# Patient Record
Sex: Male | Born: 1955 | Race: Black or African American | Hispanic: No | Marital: Married | Smoking: Never smoker
Health system: Southern US, Community
[De-identification: ages and names within clinical notes are randomized; demographics above are authoritative.]

## PROBLEM LIST (undated history)

## (undated) DIAGNOSIS — N200 Calculus of kidney: Secondary | ICD-10-CM

## (undated) DIAGNOSIS — B029 Zoster without complications: Secondary | ICD-10-CM

## (undated) DIAGNOSIS — S92001A Unspecified fracture of right calcaneus, initial encounter for closed fracture: Secondary | ICD-10-CM

## (undated) DIAGNOSIS — Z87442 Personal history of urinary calculi: Secondary | ICD-10-CM

## (undated) DIAGNOSIS — E119 Type 2 diabetes mellitus without complications: Secondary | ICD-10-CM

## (undated) HISTORY — PX: URETEROSCOPY: SHX842

## (undated) HISTORY — DX: Zoster without complications: B02.9

## (undated) HISTORY — PX: LITHOTRIPSY: SUR834

---

## 2008-04-24 ENCOUNTER — Encounter: Admission: RE | Admit: 2008-04-24 | Discharge: 2008-04-24 | Payer: Self-pay | Admitting: Orthopedic Surgery

## 2015-04-21 ENCOUNTER — Emergency Department (HOSPITAL_BASED_OUTPATIENT_CLINIC_OR_DEPARTMENT_OTHER)
Admission: EM | Admit: 2015-04-21 | Discharge: 2015-04-21 | Disposition: A | Discharge status: Home / Self Care | Payer: BLUE CROSS/BLUE SHIELD | Attending: Emergency Medicine | Admitting: Emergency Medicine

## 2015-04-21 ENCOUNTER — Emergency Department (HOSPITAL_BASED_OUTPATIENT_CLINIC_OR_DEPARTMENT_OTHER): Payer: BLUE CROSS/BLUE SHIELD

## 2015-04-21 ENCOUNTER — Encounter (HOSPITAL_BASED_OUTPATIENT_CLINIC_OR_DEPARTMENT_OTHER): Payer: Self-pay

## 2015-04-21 DIAGNOSIS — S92001A Unspecified fracture of right calcaneus, initial encounter for closed fracture: Secondary | ICD-10-CM | POA: Diagnosis not present

## 2015-04-21 DIAGNOSIS — Y929 Unspecified place or not applicable: Secondary | ICD-10-CM | POA: Insufficient documentation

## 2015-04-21 DIAGNOSIS — Y9389 Activity, other specified: Secondary | ICD-10-CM | POA: Diagnosis not present

## 2015-04-21 DIAGNOSIS — Y998 Other external cause status: Secondary | ICD-10-CM | POA: Diagnosis not present

## 2015-04-21 DIAGNOSIS — S99911A Unspecified injury of right ankle, initial encounter: Secondary | ICD-10-CM | POA: Diagnosis present

## 2015-04-21 DIAGNOSIS — W11XXXA Fall on and from ladder, initial encounter: Secondary | ICD-10-CM | POA: Insufficient documentation

## 2015-04-21 DIAGNOSIS — Z87442 Personal history of urinary calculi: Secondary | ICD-10-CM | POA: Insufficient documentation

## 2015-04-21 DIAGNOSIS — S92009A Unspecified fracture of unspecified calcaneus, initial encounter for closed fracture: Secondary | ICD-10-CM

## 2015-04-21 HISTORY — DX: Calculus of kidney: N20.0

## 2015-04-21 MED ORDER — OXYCODONE-ACETAMINOPHEN 5-325 MG PO TABS: 1.0000 | ORAL_TABLET | Freq: Four times a day (QID) | ORAL | Status: DC | PRN

## 2015-04-21 MED ORDER — OXYCODONE-ACETAMINOPHEN 5-325 MG PO TABS
1.0000 | ORAL_TABLET | Freq: Once | ORAL | Status: AC
  Administered 2015-04-21: 1 via ORAL
  Filled 2015-04-21: qty 1

## 2015-04-21 NOTE — Discharge Instructions (Signed)
Cast or Splint Care °Casts and splints support injured limbs and keep bones from moving while they heal. It is important to care for your cast or splint at home.   °HOME CARE INSTRUCTIONS °· Keep the cast or splint uncovered during the drying period. It can take 24 to 48 hours to dry if it is made of plaster. A fiberglass cast will dry in less than 1 hour. °· Do not rest the cast on anything harder than a pillow for the first 24 hours. °· Do not put weight on your injured limb or apply pressure to the cast until your health care provider gives you permission. °· Keep the cast or splint dry. Wet casts or splints can lose their shape and may not support the limb as well. A wet cast that has lost its shape can also create harmful pressure on your skin when it dries. Also, wet skin can become infected. °· Cover the cast or splint with a plastic bag when bathing or when out in the rain or snow. If the cast is on the trunk of the body, take sponge baths until the cast is removed. °· If your cast does become wet, dry it with a towel or a blow dryer on the cool setting only. °· Keep your cast or splint clean. Soiled casts may be wiped with a moistened cloth. °· Do not place any hard or soft foreign objects under your cast or splint, such as cotton, toilet paper, lotion, or powder. °· Do not try to scratch the skin under the cast with any object. The object could get stuck inside the cast. Also, scratching could lead to an infection. If itching is a problem, use a blow dryer on a cool setting to relieve discomfort. °· Do not trim or cut your cast or remove padding from inside of it. °· Exercise all joints next to the injury that are not immobilized by the cast or splint. For example, if you have a long leg cast, exercise the hip joint and toes. If you have an arm cast or splint, exercise the shoulder, elbow, thumb, and fingers. °· Elevate your injured arm or leg on 1 or 2 pillows for the first 1 to 3 days to decrease  swelling and pain. It is best if you can comfortably elevate your cast so it is higher than your heart. °SEEK MEDICAL CARE IF:  °· Your cast or splint cracks. °· Your cast or splint is too tight or too loose. °· You have unbearable itching inside the cast. °· Your cast becomes wet or develops a soft spot or area. °· You have a bad smell coming from inside your cast. °· You get an object stuck under your cast. °· Your skin around the cast becomes red or raw. °· You have new pain or worsening pain after the cast has been applied. °SEEK IMMEDIATE MEDICAL CARE IF:  °· You have fluid leaking through the cast. °· You are unable to move your fingers or toes. °· You have discolored (blue or white), cool, painful, or very swollen fingers or toes beyond the cast. °· You have tingling or numbness around the injured area. °· You have severe pain or pressure under the cast. °· You have any difficulty with your breathing or have shortness of breath. °· You have chest pain. °Document Released: 09/08/2000 Document Revised: 07/02/2013 Document Reviewed: 03/20/2013 °[employer]® Patient Information ©2015 [employer]. This information is not intended to replace advice given to you by your health care   provider. Make sure you discuss any questions you have with your health care provider.  Calcaneal Fracture Repair There are many different ways of treating fractures of the large irregular bone in the foot that makes up the heel of the foot (calcaneus). Calcaneal fractures can be treated with:   Immobilization--The fracture is casted as it is without changing the positions of the fracture involved.   Closed reduction--The bones are manipulated back into position without opening the site of the fracture using surgery.   Open reduction and internal fixation--The fracture site is opened and the bone pieces are fixed into place with some type of hardware (such as a screw).   Primary arthrodesis--The joint has enough damage that a  procedure is done as the first treatment which will leave the joint permanently stiff. This will decrease function, however usually will leave the joint pain free. LET [facility] CARE PROVIDER KNOW ABOUT:  Any allergies you have.   All medicines you are taking, including vitamins, herbs, eye drops, creams, and over-the-counter medicines.   Previous problems you or members of your family have had with the use of anesthetics.  Any blood disorders you have.   Previous surgeries you have had.   Medical conditions you have.  RISKS AND COMPLICATIONS Generally, calcaneal fracture repair is a safe procedure. However, as with any procedure, complications can occur. Possible complications include:   Swelling of the foot and ankle.  Infection of the wound or bone.  Arthritis.  Chronic pain of the foot.  Nerve injury.  Blood clot in the legs or lungs. BEFORE THE PROCEDURE  Ask your health care provider about changing or stopping your regular medicines. You may need to stop taking certain medicines, such as aspirin or blood thinners, at least 1 week before the surgery.  X-rays and any imaging studies are reviewed with your healthcare provider. The surgeon will advise you on the best surgical approach to repair your fracture.  Do not eat or drink anything for at least 8 hours before the surgery or as directed by your health care provider.   If you smoke, do not smoke for at least 2 weeks before the surgery.   Make plans to have someone drive you home after the procedure. Also arrange for someone to help you with activities during recovery.  PROCEDURE   You will be given medicine to help you relax (sedative). You will then be given medicine to make you sleep through the procedure (general anesthetic). These medicines will be given through an IV access tube that is put into one of your veins.   A nerve block or numbing medicine (local anesthetic) may also be used to keep you  comfortable.  Once you are asleep, the foot will be cleaned and shaved if needed.  The surgeon may use a percutaneous or open technique for this surgery:  In the percutaneous approach, small cuts and pins are used to repair the fracture.  In the open technique, a cut is made along the outside of the foot and the bone pieces are placed back together with hardware. A drain may be left to collect fluid. It is removed 3-4 days after the procedure.  The surgeon then uses staples or stitches to close the incision or cuts. AFTER THE PROCEDURE  After surgery you will be taken to the recovery area where a nurse will watch and check your progress for 1-3 hours. Once you are awake, stable, and taking fluids well, and if you do not  have any other problems, you will be allowed to go home.  You will be given pain medicine if needed.  The IV access tube will be removed before you are discharged. Document Released: 06/21/2005 Document Revised: 07/02/2013 Document Reviewed: 04/15/2013 [facility] Patient Information 2015 [place], [place]. This information is not intended to replace advice given to you by your health care provider. Make sure you discuss any questions you have with your health care provider.

## 2015-04-21 NOTE — ED Notes (Signed)
Patient transported to CT 

## 2015-04-21 NOTE — ED Provider Notes (Signed)
CSN: [id number]     Arrival date & time 04/21/15  1442 History   First MD Initiated Contact with Patient 04/21/15 1447     Chief Complaint  Patient presents with  . Ankle Injury     (Consider location/radiation/quality/duration/timing/severity/associated sxs/prior Treatment) HPI Comments: Pt. Is a 59 y/o M who presents today after falling from 2-3 ft. Ladder while trying to trim his bushes. He says that he lost his balance, and fell but his right foot remained stuck in the ladder rung. He says that his right foot twisted laterally, and he immediately experienced pain. He there was no abnormal angulation, but he was unable to bear weight. He also had immediate swelling. He did not have any loss of sensation, numbness, tingling, or change in color. He did not have any pain in his knee, or hip. He says he did not hit his head. No LOC. He fell on his right side, but he says that he has no pain any where else.   The history is provided by the patient.    Past Medical History  Diagnosis Date  . Kidney stone    History reviewed. No pertinent past surgical history. No family history on file. History  Substance Use Topics  . Smoking status: Never Smoker   . Smokeless tobacco: Not on file  . Alcohol Use: No    Review of Systems  Constitutional: Positive for activity change. Negative for fever.  HENT: Negative for congestion, facial swelling, postnasal drip, sinus pressure and sore throat.   Eyes: Negative.  Negative for pain and redness.  Respiratory: Negative.  Negative for cough, choking, chest tightness and shortness of breath.   Cardiovascular: Negative.  Negative for chest pain and leg swelling.  Gastrointestinal: Negative.  Negative for nausea and vomiting.  Endocrine: Negative.  Negative for cold intolerance and heat intolerance.  Genitourinary: Negative.  Negative for urgency and frequency.  Musculoskeletal: Positive for joint swelling and gait problem. Negative for myalgias, back  pain, arthralgias, neck pain and neck stiffness.  Skin: Negative.  Negative for pallor, rash and wound.  Allergic/Immunologic: Negative.  Negative for environmental allergies.  Neurological: Negative.  Negative for dizziness, weakness, light-headedness, numbness and headaches.  Hematological: Negative.  Negative for adenopathy. Does not bruise/bleed easily.  Psychiatric/Behavioral: Negative.  Negative for agitation.      Allergies  Review of patient's allergies indicates no known allergies.  Home Medications   Prior to Admission medications   Not on File   BP 156/90 mmHg  Pulse 95  Temp(Src) 98.4 F (36.9 C) (Oral)  Resp 18  Ht 6\' 4"  (1.93 m)  Wt 225 lb (102.059 kg)  BMI 27.40 kg/m2  SpO2 98% Physical Exam  Constitutional: He is oriented to person, place, and time. He appears well-developed and well-nourished. No distress.  HENT:  Head: Normocephalic and atraumatic.  Mouth/Throat: Oropharynx is clear and moist. No oropharyngeal exudate.  Eyes: Conjunctivae and EOM are normal. Pupils are equal, round, and reactive to light. Right eye exhibits no discharge. Left eye exhibits no discharge.  Neck: Normal range of motion. Neck supple.  Cardiovascular: Normal rate, regular rhythm, normal heart sounds and intact distal pulses.  Exam reveals no gallop and no friction rub.   No murmur heard. Pulmonary/Chest: Effort normal and breath sounds normal. No respiratory distress. He has no wheezes. He has no rales.  Abdominal: Soft. Bowel sounds are normal. He exhibits no distension and no mass. There is no tenderness. There is no rebound and no guarding.  Musculoskeletal: He exhibits no edema.       Right ankle: He exhibits decreased range of motion and swelling. He exhibits no ecchymosis, no deformity, no laceration and normal pulse. Tenderness. Lateral malleolus and medial malleolus tenderness found. Achilles tendon normal.       Feet:  Lymphadenopathy:    He has no cervical adenopathy.    Neurological: He is alert and oriented to person, place, and time. He displays normal reflexes. No cranial nerve deficit. He exhibits normal muscle tone. Coordination normal.  Skin: Skin is warm and dry. No rash noted. He is not diaphoretic. No erythema. No pallor.  Psychiatric: He has a normal mood and affect. His behavior is normal.    ED Course  Procedures (including critical care time) Labs Review Labs Reviewed - No data to display  Imaging Review Dg Ankle Complete Right  04/21/2015   CLINICAL DATA:  Pain following fall from ladder  EXAM: RIGHT ANKLE - COMPLETE 3+ VIEW  COMPARISON:  None.  FINDINGS: Frontal, oblique, and lateral views obtained. There is generalized soft tissue swelling. There is a comminuted calcaneal fracture with multiple impacted fracture fragments throughout the calcaneus. Displaced fragments are noted primarily posteriorly, although areas of fracture are seen through the anterior to mid aspects of the bone as well. There is no gross dislocation. No fractures outside of the calcaneus are identified. The ankle mortise appears intact.  IMPRESSION: Comminuted fracture of the calcaneus with multiple impacted fracture fragments. Given the degree of fracture involvement with the calcaneus, correlation with noncontrast enhanced calcaneal CT would be advised. No fracture outside of the calcaneus seen. Ankle mortise appears intact.   Electronically Signed   By: Lowella Grip III M.D.   On: 04/21/2015 15:12     EKG Interpretation None      MDM   Final diagnoses:  Calcaneus fracture   Pt. Is a 59 y/o M here with comminuted calcaneal fracture. Neurovascularly intact. Will get CT calcaneal series. Jones dressing and Ice / Crutches. Pain control with percocet going home. Discussed with orthopedics Dr. Lorre Nick. He recommended the above, and will find out where he should follow up for further intervention. Signed out to Dr. Ralene Bathe - Attending at 4:00 pm.      Aquilla Hacker,  MD 04/21/15 [place], MD 04/21/15 [phone]

## 2015-04-21 NOTE — ED Notes (Signed)
Injured right ankle falling off ladder approx 230pm

## 2015-04-21 NOTE — ED Notes (Signed)
Family at bedside. 

## 2015-04-22 ENCOUNTER — Other Ambulatory Visit (HOSPITAL_COMMUNITY): Payer: Self-pay | Admitting: Orthopedic Surgery

## 2015-04-23 ENCOUNTER — Encounter (HOSPITAL_COMMUNITY): Payer: Self-pay | Admitting: *Deleted

## 2015-04-23 NOTE — Progress Notes (Signed)
Pt denies SOB, chest pain, and being under the care of a cardiologist. Pt denies having a stress test, echo, and cardiac cath. Pt denies having a chest x ray and EKG within the last year. Pt made aware to stop  taking Aspirin, otc vitamins and herbal medications. Do not take any NSAIDs ie: Ibuprofen, Advil, Naproxen or any medication containing Aspirin. Pt verbalized understanding of all pre-op instructions.

## 2015-04-25 ENCOUNTER — Encounter (HOSPITAL_COMMUNITY): Payer: Self-pay | Admitting: Anesthesiology

## 2015-04-25 ENCOUNTER — Ambulatory Visit (HOSPITAL_COMMUNITY): Payer: BLUE CROSS/BLUE SHIELD | Admitting: Anesthesiology

## 2015-04-25 ENCOUNTER — Encounter (HOSPITAL_COMMUNITY)
Admission: RE | Disposition: A | Discharge status: Home / Self Care | Payer: Self-pay | Source: Ambulatory Visit | Attending: Orthopedic Surgery

## 2015-04-25 ENCOUNTER — Ambulatory Visit (HOSPITAL_COMMUNITY)
Admission: RE | Admit: 2015-04-25 | Discharge: 2015-04-25 | Disposition: A | Discharge status: Home / Self Care | Payer: BLUE CROSS/BLUE SHIELD | Source: Ambulatory Visit | Attending: Orthopedic Surgery | Admitting: Orthopedic Surgery

## 2015-04-25 DIAGNOSIS — W11XXXA Fall on and from ladder, initial encounter: Secondary | ICD-10-CM | POA: Insufficient documentation

## 2015-04-25 DIAGNOSIS — S92001A Unspecified fracture of right calcaneus, initial encounter for closed fracture: Secondary | ICD-10-CM

## 2015-04-25 HISTORY — DX: Unspecified fracture of right calcaneus, initial encounter for closed fracture: S92.001A

## 2015-04-25 HISTORY — PX: ORIF CALCANEOUS FRACTURE: SHX5030

## 2015-04-25 SURGERY — OPEN REDUCTION INTERNAL FIXATION (ORIF) CALCANEOUS FRACTURE
Anesthesia: Regional | Site: Ankle | Laterality: Right

## 2015-04-25 MED ORDER — OXYCODONE HCL 5 MG/5ML PO SOLN: 5.0000 mg | Freq: Once | ORAL | Status: DC | PRN

## 2015-04-25 MED ORDER — MIDAZOLAM HCL 5 MG/5ML IJ SOLN
INTRAMUSCULAR | Status: DC | PRN
  Administered 2015-04-25: 2 mg via INTRAVENOUS

## 2015-04-25 MED ORDER — 0.9 % SODIUM CHLORIDE (POUR BTL) OPTIME
TOPICAL | Status: DC | PRN
  Administered 2015-04-25: 1000 mL

## 2015-04-25 MED ORDER — GLYCOPYRROLATE 0.2 MG/ML IJ SOLN
INTRAMUSCULAR | Status: AC
  Filled 2015-04-25: qty 1

## 2015-04-25 MED ORDER — MEPERIDINE HCL 25 MG/ML IJ SOLN: 6.2500 mg | INTRAMUSCULAR | Status: DC | PRN

## 2015-04-25 MED ORDER — ASPIRIN EC 325 MG PO TBEC: 325.0000 mg | DELAYED_RELEASE_TABLET | Freq: Every day | ORAL | Status: DC

## 2015-04-25 MED ORDER — FENTANYL CITRATE (PF) 100 MCG/2ML IJ SOLN
INTRAMUSCULAR | Status: DC | PRN
  Administered 2015-04-25 (×3): 50 ug via INTRAVENOUS

## 2015-04-25 MED ORDER — ONDANSETRON HCL 4 MG/2ML IJ SOLN
INTRAMUSCULAR | Status: DC | PRN
  Administered 2015-04-25: 4 mg via INTRAVENOUS

## 2015-04-25 MED ORDER — ROCURONIUM BROMIDE 50 MG/5ML IV SOLN
INTRAVENOUS | Status: AC
  Filled 2015-04-25: qty 1

## 2015-04-25 MED ORDER — PROPOFOL 10 MG/ML IV BOLUS
INTRAVENOUS | Status: AC
  Filled 2015-04-25: qty 20

## 2015-04-25 MED ORDER — HYDROMORPHONE HCL 1 MG/ML IJ SOLN: 0.2500 mg | INTRAMUSCULAR | Status: DC | PRN

## 2015-04-25 MED ORDER — FENTANYL CITRATE (PF) 250 MCG/5ML IJ SOLN
INTRAMUSCULAR | Status: AC
  Filled 2015-04-25: qty 5

## 2015-04-25 MED ORDER — EPHEDRINE SULFATE 50 MG/ML IJ SOLN
INTRAMUSCULAR | Status: DC | PRN
  Administered 2015-04-25 (×4): 10 mg via INTRAVENOUS

## 2015-04-25 MED ORDER — BUPIVACAINE-EPINEPHRINE (PF) 0.5% -1:200000 IJ SOLN
INTRAMUSCULAR | Status: DC | PRN
  Administered 2015-04-25: 30 mL via PERINEURAL

## 2015-04-25 MED ORDER — ONDANSETRON HCL 4 MG/2ML IJ SOLN
INTRAMUSCULAR | Status: AC
  Filled 2015-04-25: qty 2

## 2015-04-25 MED ORDER — LIDOCAINE HCL (CARDIAC) 20 MG/ML IV SOLN
INTRAVENOUS | Status: DC | PRN
  Administered 2015-04-25: 100 mg via INTRAVENOUS

## 2015-04-25 MED ORDER — CEFAZOLIN SODIUM-DEXTROSE 2-3 GM-% IV SOLR
INTRAVENOUS | Status: AC
  Filled 2015-04-25: qty 50

## 2015-04-25 MED ORDER — SODIUM CHLORIDE 0.9 % IJ SOLN
INTRAMUSCULAR | Status: AC
  Filled 2015-04-25: qty 10

## 2015-04-25 MED ORDER — PROPOFOL 10 MG/ML IV BOLUS
INTRAVENOUS | Status: DC | PRN
  Administered 2015-04-25: 300 mg via INTRAVENOUS

## 2015-04-25 MED ORDER — DEXAMETHASONE SODIUM PHOSPHATE 4 MG/ML IJ SOLN
INTRAMUSCULAR | Status: AC
Start: 2015-04-25 — End: 2015-04-25
  Filled 2015-04-25: qty 1

## 2015-04-25 MED ORDER — PHENYLEPHRINE 40 MCG/ML (10ML) SYRINGE FOR IV PUSH (FOR BLOOD PRESSURE SUPPORT)
PREFILLED_SYRINGE | INTRAVENOUS | Status: AC
  Filled 2015-04-25: qty 10

## 2015-04-25 MED ORDER — OXYCODONE HCL 5 MG PO TABS: 5.0000 mg | ORAL_TABLET | Freq: Once | ORAL | Status: DC | PRN

## 2015-04-25 MED ORDER — CEFAZOLIN SODIUM-DEXTROSE 2-3 GM-% IV SOLR
2.0000 g | Freq: Once | INTRAVENOUS | Status: AC
  Administered 2015-04-25: 2 g via INTRAVENOUS
  Filled 2015-04-25: qty 50

## 2015-04-25 MED ORDER — MIDAZOLAM HCL 2 MG/2ML IJ SOLN
INTRAMUSCULAR | Status: AC
  Filled 2015-04-25: qty 2

## 2015-04-25 MED ORDER — PHENYLEPHRINE HCL 10 MG/ML IJ SOLN
INTRAMUSCULAR | Status: DC | PRN
  Administered 2015-04-25 (×2): 80 ug via INTRAVENOUS
  Administered 2015-04-25 (×4): 40 ug via INTRAVENOUS

## 2015-04-25 MED ORDER — LIDOCAINE HCL (CARDIAC) 20 MG/ML IV SOLN
INTRAVENOUS | Status: AC
  Filled 2015-04-25: qty 5

## 2015-04-25 MED ORDER — DEXAMETHASONE SODIUM PHOSPHATE 4 MG/ML IJ SOLN
INTRAMUSCULAR | Status: DC | PRN
  Administered 2015-04-25: 4 mg via INTRAVENOUS

## 2015-04-25 MED ORDER — SUCCINYLCHOLINE CHLORIDE 20 MG/ML IJ SOLN
INTRAMUSCULAR | Status: AC
  Filled 2015-04-25: qty 1

## 2015-04-25 MED ORDER — LACTATED RINGERS IV SOLN
INTRAVENOUS | Status: DC | PRN
  Administered 2015-04-25 (×2): via INTRAVENOUS

## 2015-04-25 MED ORDER — OXYCODONE-ACETAMINOPHEN 5-325 MG PO TABS: 1.0000 | ORAL_TABLET | ORAL | Status: DC | PRN

## 2015-04-25 MED ORDER — EPHEDRINE SULFATE 50 MG/ML IJ SOLN
INTRAMUSCULAR | Status: AC
  Filled 2015-04-25: qty 1

## 2015-04-25 SURGICAL SUPPLY — 55 items
BANDAGE ELASTIC 4 VELCRO ST LF (GAUZE/BANDAGES/DRESSINGS) IMPLANT
BANDAGE ELASTIC 6 VELCRO ST LF (GAUZE/BANDAGES/DRESSINGS) IMPLANT
BANDAGE ESMARK 6X9 LF (GAUZE/BANDAGES/DRESSINGS) IMPLANT
BIT DRILL LCP QC 2X140 (BIT) ×3 IMPLANT
BNDG COHESIVE 6X5 TAN STRL LF (GAUZE/BANDAGES/DRESSINGS) ×3 IMPLANT
BNDG ESMARK 6X9 LF (GAUZE/BANDAGES/DRESSINGS)
BNDG GAUZE ELAST 4 BULKY (GAUZE/BANDAGES/DRESSINGS) ×3 IMPLANT
BNDG GAUZE STRTCH 6 (GAUZE/BANDAGES/DRESSINGS) ×9 IMPLANT
COTTON STERILE ROLL (GAUZE/BANDAGES/DRESSINGS) ×3 IMPLANT
COVER SURGICAL LIGHT HANDLE (MISCELLANEOUS) ×6 IMPLANT
CUFF TOURNIQUET SINGLE 34IN LL (TOURNIQUET CUFF) IMPLANT
CUFF TOURNIQUET SINGLE 44IN (TOURNIQUET CUFF) IMPLANT
DRAPE INCISE IOBAN 66X45 STRL (DRAPES) ×3 IMPLANT
DRAPE OEC MINIVIEW 54X84 (DRAPES) ×3 IMPLANT
DRAPE PROXIMA HALF (DRAPES) ×3 IMPLANT
DRAPE U-SHAPE 47X51 STRL (DRAPES) ×3 IMPLANT
DRSG ADAPTIC 3X8 NADH LF (GAUZE/BANDAGES/DRESSINGS) ×3 IMPLANT
DRSG PAD ABDOMINAL 8X10 ST (GAUZE/BANDAGES/DRESSINGS) ×3 IMPLANT
DURAPREP 26ML APPLICATOR (WOUND CARE) ×3 IMPLANT
ELECT REM PT RETURN 9FT ADLT (ELECTROSURGICAL) ×3
ELECTRODE REM PT RTRN 9FT ADLT (ELECTROSURGICAL) ×1 IMPLANT
GAUZE SPONGE 4X4 12PLY STRL (GAUZE/BANDAGES/DRESSINGS) ×3 IMPLANT
GLOVE BIOGEL PI IND STRL 9 (GLOVE) ×1 IMPLANT
GLOVE BIOGEL PI INDICATOR 9 (GLOVE) ×2
GLOVE SURG ORTHO 9.0 STRL STRW (GLOVE) ×3 IMPLANT
GOWN STRL REUS W/ TWL XL LVL3 (GOWN DISPOSABLE) ×3 IMPLANT
GOWN STRL REUS W/TWL XL LVL3 (GOWN DISPOSABLE) ×6
KIT BASIN OR (CUSTOM PROCEDURE TRAY) ×3 IMPLANT
KIT ROOM TURNOVER OR (KITS) ×3 IMPLANT
MANIFOLD NEPTUNE II (INSTRUMENTS) ×3 IMPLANT
NS IRRIG 1000ML POUR BTL (IV SOLUTION) ×3 IMPLANT
PACK ORTHO EXTREMITY (CUSTOM PROCEDURE TRAY) ×3 IMPLANT
PAD ARMBOARD 7.5X6 YLW CONV (MISCELLANEOUS) ×6 IMPLANT
PADDING CAST COTTON 6X4 STRL (CAST SUPPLIES) ×3 IMPLANT
PLATE CALCANEAL LOCK RT VA 2.7 (Plate) ×3 IMPLANT
SCREW 2.7X28MM (Screw) ×6 IMPLANT
SCREW 2.7X38MM (Screw) ×3 IMPLANT
SCREW LOCKING 2.7X44MM VA (Screw) ×3 IMPLANT
SCREW LOCKING VA 2.7X40MM (Screw) ×3 IMPLANT
SCREW LOCKING VA 2.7X50MM (Screw) ×3 IMPLANT
SCREW METAPHYSEAL 2.7X30 (Screw) ×3 IMPLANT
SCREW METAPHYSEAL 2.7X42MM (Screw) ×3 IMPLANT
SCREW SHANZ 5.0X125 (Screw) ×3 IMPLANT
SCREW VA LOCKING 2.7X36 (Screw) ×2 IMPLANT
SCREW VA LOCKING 2.7X36 VA (Screw) ×1 IMPLANT
SPONGE LAP 18X18 X RAY DECT (DISPOSABLE) ×3 IMPLANT
STAPLER VISISTAT 35W (STAPLE) IMPLANT
SUCTION FRAZIER TIP 10 FR DISP (SUCTIONS) ×3 IMPLANT
SUT ETHILON 2 0 PSLX (SUTURE) IMPLANT
SUT VIC AB 2-0 CTB1 (SUTURE) ×6 IMPLANT
TOWEL OR 17X24 6PK STRL BLUE (TOWEL DISPOSABLE) ×3 IMPLANT
TOWEL OR 17X26 10 PK STRL BLUE (TOWEL DISPOSABLE) ×3 IMPLANT
TUBE CONNECTING 12'X1/4 (SUCTIONS) ×1
TUBE CONNECTING 12X1/4 (SUCTIONS) ×2 IMPLANT
WATER STERILE IRR 1000ML POUR (IV SOLUTION) ×3 IMPLANT

## 2015-04-25 NOTE — Discharge Instructions (Signed)
Calcaneal Fracture Repair  There are many different ways of treating fractures of the large irregular bone in the foot that makes up the heel of the foot (calcaneus). Calcaneal fractures can be treated with:    Immobilization--The fracture is casted as it is without changing the positions of the fracture involved.    Closed reduction--The bones are manipulated back into position without opening the site of the fracture using surgery.    Open reduction and internal fixation--The fracture site is opened and the bone pieces are fixed into place with some type of hardware (such as a screw).    Primary arthrodesis--The joint has enough damage that a procedure is done as the first treatment which will leave the joint permanently stiff. This will decrease function, however usually will leave the joint pain free.  LET YOUR HEALTH CARE PROVIDER KNOW ABOUT:   Any allergies you have.    All medicines you are taking, including vitamins, herbs, eye drops, creams, and over-the-counter medicines.    Previous problems you or members of your family have had with the use of anesthetics.   Any blood disorders you have.    Previous surgeries you have had.    Medical conditions you have.   RISKS AND COMPLICATIONS  Generally, calcaneal fracture repair is a safe procedure. However, as with any procedure, complications can occur. Possible complications include:    Swelling of the foot and ankle.   Infection of the wound or bone.   Arthritis.   Chronic pain of the foot.   Nerve injury.   Blood clot in the legs or lungs.  BEFORE THE PROCEDURE   Ask your health care provider about changing or stopping your regular medicines. You may need to stop taking certain medicines, such as aspirin or blood thinners, at least 1 week before the surgery.   X-rays and any imaging studies are reviewed with your healthcare provider. The surgeon will advise you on the best surgical approach to repair your fracture.   Do not eat or  drink anything for at least 8 hours before the surgery or as directed by your health care provider.    If you smoke, do not smoke for at least 2 weeks before the surgery.    Make plans to have someone drive you home after the procedure. Also arrange for someone to help you with activities during recovery.   PROCEDURE    You will be given medicine to help you relax (sedative). You will then be given medicine to make you sleep through the procedure (general anesthetic). These medicines will be given through an IV access tube that is put into one of your veins.    A nerve block or numbing medicine (local anesthetic) may also be used to keep you comfortable.   Once you are asleep, the foot will be cleaned and shaved if needed.   The surgeon may use a percutaneous or open technique for this surgery:    In the percutaneous approach, small cuts and pins are used to repair the fracture.    In the open technique, a cut is made along the outside of the foot and the bone pieces are placed back together with hardware. A drain may be left to collect fluid. It is removed 3-4 days after the procedure.   The surgeon then uses staples or stitches to close the incision or cuts.  AFTER THE PROCEDURE   After surgery you will be taken to the recovery area where a nurse will   pain medicine if needed.  The IV access tube will be removed before you are discharged. Document Released: 06/21/2005 Document Revised: 07/02/2013 Document Reviewed: 04/15/2013 [facility] Patient Information 2015 [place], [place]. This information is not intended to replace advice given to you by your health care provider. Make sure you discuss any questions you have with your health care provider.

## 2015-04-25 NOTE — Addendum Note (Signed)
Addendum  created 04/25/15 1142 by Jenne Campus, CRNA   Modules edited: Clinical Notes   Clinical Notes:  File: [id number]

## 2015-04-25 NOTE — Anesthesia Postprocedure Evaluation (Signed)
  Anesthesia Post-op Note  Patient: Stephen Gill  Procedure(s) Performed: Procedure(s): OPEN REDUCTION INTERNAL FIXATION (ORIF) RIGHT CALCANEOUS FRACTURE (Right)  Patient Location: PACU  Anesthesia Type: General, Regional   Level of Consciousness: awake, alert  and oriented  Airway and Oxygen Therapy: Patient Spontanous Breathing  Post-op Pain: none  Post-op Assessment: Post-op Vital signs reviewed  Post-op Vital Signs: Reviewed  Last Vitals:  Filed Vitals:   04/25/15 0950  BP: 136/74  Pulse: 72  Temp: 36.4 C  Resp: 16    Complications: No apparent anesthesia complications

## 2015-04-25 NOTE — Op Note (Signed)
04/25/2015  9:38 AM  PATIENT:  Stephen Gill    PRE-OPERATIVE DIAGNOSIS:  Right Calcaneus Fracture  POST-OPERATIVE DIAGNOSIS:  Same  PROCEDURE:  OPEN REDUCTION INTERNAL FIXATION (ORIF) RIGHT CALCANEOUS FRACTURE  SURGEON:  Newt Minion, MD  PHYSICIAN ASSISTANT:None ANESTHESIA:   General  PREOPERATIVE INDICATIONS:  [name] is a  59 y.o. male with a diagnosis of Right Calcaneus Fracture who failed conservative measures and elected for surgical management.    The risks benefits and alternatives were discussed with the patient preoperatively including but not limited to the risks of infection, bleeding, nerve injury, cardiopulmonary complications, the need for revision surgery, among others, and the patient was willing to proceed.  OPERATIVE IMPLANTS: Large Synthes calcaneal locking plate  OPERATIVE FINDINGS: Severe comminution of the calcaneus  OPERATIVE PROCEDURE: Patient was brought to the operating room and underwent a general and aesthetic. After adequate levels anesthesia obtained patient was placed in the left lateral decubitus position with the right side up and the right lower extremity was prepped using DuraPrep draped into a sterile field Ioban was used to cover all exposed skin. A timeout was called. An extensile lateral incision was made this was carried sharply down to bone subperiosteal elevation was performed of the soft tissue envelope and this was elevated. A pin was then placed into the talus to hold the skin flap elevated. The skin flap was continuously irrigated with normal saline throughout the case. Multiple loose fragments in the lateral wall were removed. The lateral aspect of the posterior facet was then elevated and reduced to the medial wall of the posterior facet. A Steinmann pin was placed in the calcaneus to correct the varus alignment of the calcaneus. The calcaneal neck was reduced to the posterior facet. A large plate was placed laterally and using  compression screws this stabilized the construct. Locking screws were then placed to stabilize the subtalar joint. C-arm floss be verified alignment in both axial and lateral planes. The lateral wall fragments were replaced prior to placing the plate. The subcutaneous is closed using 20 bicoronal and the incision was closed using 2-0 nylon with a Algower Donati suture technique. A compressive dressing was applied. Patient was extubated taken to the PACU in stable condition.

## 2015-04-25 NOTE — Transfer of Care (Signed)
Immediate Anesthesia Transfer of Care Note  Patient: Stephen Gill  Procedure(s) Performed: Procedure(s): OPEN REDUCTION INTERNAL FIXATION (ORIF) RIGHT CALCANEOUS FRACTURE (Right)  Patient Location: PACU  Anesthesia Type:General  Level of Consciousness: awake, oriented and patient cooperative  Airway & Oxygen Therapy: Patient Spontanous Breathing and Patient connected to nasal cannula oxygen  Post-op Assessment: Report given to RN and Post -op Vital signs reviewed and stable  Post vital signs: Reviewed  Last Vitals:  Filed Vitals:   04/25/15 0950  BP: 136/74  Pulse: 72  Temp: 36.4 C  Resp: 16    Complications: No apparent anesthesia complications

## 2015-04-25 NOTE — Progress Notes (Signed)
Orthopedic Tech Progress Note Patient Details:  Stephen Gill [birth year] [id number]  Ortho Devices Type of Ortho Device: CAM walker Ortho Device/Splint Interventions: Application   [name], [name] 04/25/2015, 12:57 PM

## 2015-04-25 NOTE — Anesthesia Procedure Notes (Addendum)
Anesthesia Regional Block:  Popliteal block  Pre-Anesthetic Checklist: ,, timeout performed, Correct Patient, Correct Site, Correct Laterality, Correct Procedure, Correct Position, site marked, Risks and benefits discussed,  Surgical consent,  Pre-op evaluation,  At surgeon's request and post-op pain management  Laterality: Right and Lower  Prep: chloraprep       Needles:  Injection technique: Single-shot  Needle Type: Echogenic Needle     Needle Length: 9cm 9 cm Needle Gauge: 21 and 21 G    Additional Needles:  Procedures: ultrasound guided (picture in chart) Popliteal block Narrative:  Start time: 04/25/2015 7:42 AM End time: 04/25/2015 7:46 AM Injection made incrementally with aspirations every 5 mL.  Performed by: Personally  Anesthesiologist: CREWS, DAVID   Procedure Name: LMA Insertion Date/Time: 04/25/2015 8:05 AM Performed by: Jenne Campus Pre-anesthesia Checklist: Patient identified, Emergency Drugs available, Suction available, Patient being monitored and Timeout performed Patient Re-evaluated:Patient Re-evaluated prior to inductionOxygen Delivery Method: Circle system utilized Preoxygenation: Pre-oxygenation with 100% oxygen Intubation Type: IV induction Ventilation: Mask ventilation without difficulty LMA: LMA inserted LMA Size: 5.0 Number of attempts: 1 Placement Confirmation: positive ETCO2,  CO2 detector and breath sounds checked- equal and bilateral Tube secured with: Tape Dental Injury: Teeth and Oropharynx as per pre-operative assessment

## 2015-04-25 NOTE — Anesthesia Preprocedure Evaluation (Addendum)
Anesthesia Evaluation  Patient identified by MRN, date of birth, ID band Patient awake    Reviewed: Allergy & Precautions, NPO status , Patient's Chart, lab work & pertinent test results  History of Anesthesia Complications Negative for: history of anesthetic complications  Airway Mallampati: I  TM Distance: >3 FB Neck ROM: Full    Dental  (+) Teeth Intact, Dental Advisory Given   Pulmonary neg pulmonary ROS,  breath sounds clear to auscultation        Cardiovascular negative cardio ROS  Rhythm:Regular Rate:Normal     Neuro/Psych negative neurological ROS  negative psych ROS   GI/Hepatic negative GI ROS, Neg liver ROS,   Endo/Other  negative endocrine ROS  Renal/GU      Musculoskeletal   Abdominal   Peds  Hematology negative hematology ROS (+)   Anesthesia Other Findings   Reproductive/Obstetrics                            Anesthesia Physical Anesthesia Plan  ASA: I  Anesthesia Plan: General and Regional   Post-op Pain Management:    Induction: Intravenous  Airway Management Planned: LMA  Additional Equipment:   Intra-op Plan:   Post-operative Plan: Extubation in OR  Informed Consent: I have reviewed the patients History and Physical, chart, labs and discussed the procedure including the risks, benefits and alternatives for the proposed anesthesia with the patient or authorized representative who has indicated his/her understanding and acceptance.   Dental advisory given  Plan Discussed with: CRNA, Anesthesiologist and Surgeon  Anesthesia Plan Comments:         Anesthesia Quick Evaluation

## 2015-04-25 NOTE — H&P (Signed)
Stephen Gill is an 59 y.o. male.   Chief Complaint: Right calcaneus fracture HPI: Patient is a 59 year old gentleman who fell from a ladder sustaining a closed right calcaneus fracture.  Past Medical History  Diagnosis Date  . Kidney stone   . Calcaneus fracture, right     Past Surgical History  Procedure Laterality Date  . Ureteroscopy      Family History  Problem Relation Age of Onset  . Hypertension Mother   . Heart attack Brother    Social History:  reports that he has never smoked. He has never used smokeless tobacco. He reports that he does not drink alcohol or use illicit drugs.  Allergies: No Known Allergies  Medications Prior to Admission  Medication Sig Dispense Refill  . oxyCODONE-acetaminophen (PERCOCET/ROXICET) 5-325 MG per tablet Take 1 tablet by mouth every 6 (six) hours as needed for severe pain. (Patient not taking: Reported on 04/23/2015) 15 tablet 0    No results found for this or any previous visit (from the past 48 hour(s)). No results found.  Review of Systems  All other systems reviewed and are negative.   Blood pressure 127/87, pulse 65, temperature 97.5 F (36.4 C), resp. rate 15, SpO2 100 %. Physical Exam  On examination patient can move his toes. He has a hard splint which was not removed. Radiographs shows a comminuted intra-articular posterior facet calcaneal fracture sanders 3 part Assessment/Plan Assessment: Right calcaneus fracture.  Plan: We'll plan for open reduction internal fixation risks and benefits were discussed including infection neurovascular injury pain DVT arthritis need for additional surgery. Patient states he understands and wishes to proceed at this time.  Lagena Strand V 04/25/2015, 7:30 AM

## 2015-04-26 ENCOUNTER — Encounter (HOSPITAL_COMMUNITY): Payer: Self-pay | Admitting: Orthopedic Surgery

## 2015-04-30 ENCOUNTER — Encounter (HOSPITAL_COMMUNITY): Payer: Self-pay | Admitting: Orthopedic Surgery

## 2018-08-16 ENCOUNTER — Encounter (HOSPITAL_COMMUNITY): Payer: Self-pay | Admitting: Emergency Medicine

## 2018-08-16 ENCOUNTER — Emergency Department (HOSPITAL_COMMUNITY): Payer: BLUE CROSS/BLUE SHIELD

## 2018-08-16 ENCOUNTER — Emergency Department (HOSPITAL_COMMUNITY)
Admission: EM | Admit: 2018-08-16 | Discharge: 2018-08-16 | Disposition: A | Discharge status: Home / Self Care | Payer: BLUE CROSS/BLUE SHIELD | Attending: Emergency Medicine | Admitting: Emergency Medicine

## 2018-08-16 ENCOUNTER — Other Ambulatory Visit: Payer: Self-pay

## 2018-08-16 DIAGNOSIS — R1031 Right lower quadrant pain: Secondary | ICD-10-CM | POA: Diagnosis present

## 2018-08-16 DIAGNOSIS — N201 Calculus of ureter: Secondary | ICD-10-CM | POA: Diagnosis not present

## 2018-08-16 LAB — URINALYSIS, ROUTINE W REFLEX MICROSCOPIC
BILIRUBIN URINE: NEGATIVE
Glucose, UA: >=500 mg/dL — AB
KETONES UR: 5 mg/dL — AB
NITRITE: NEGATIVE
Protein, ur: 30 mg/dL — AB
SPECIFIC GRAVITY, URINE: 1.027 (ref 1.005–1.030)
pH: 6 (ref 5.0–8.0)

## 2018-08-16 MED ORDER — TAMSULOSIN HCL 0.4 MG PO CAPS
0.4000 mg | ORAL_CAPSULE | Freq: Once | ORAL | Status: AC
  Administered 2018-08-16: 0.4 mg via ORAL
  Filled 2018-08-16: qty 1

## 2018-08-16 MED ORDER — TAMSULOSIN HCL 0.4 MG PO CAPS: ORAL_CAPSULE | ORAL | 0 refills | Status: DC

## 2018-08-16 MED ORDER — ONDANSETRON 8 MG PO TBDP: 8.0000 mg | ORAL_TABLET | Freq: Three times a day (TID) | ORAL | 0 refills | Status: DC | PRN

## 2018-08-16 MED ORDER — HYDROMORPHONE HCL 1 MG/ML IJ SOLN
1.0000 mg | Freq: Once | INTRAMUSCULAR | Status: AC
  Administered 2018-08-16: 1 mg via INTRAVENOUS
  Filled 2018-08-16: qty 1

## 2018-08-16 MED ORDER — CIPROFLOXACIN HCL 500 MG PO TABS
500.0000 mg | ORAL_TABLET | Freq: Once | ORAL | Status: AC
  Administered 2018-08-16: 500 mg via ORAL
  Filled 2018-08-16: qty 1

## 2018-08-16 MED ORDER — CIPROFLOXACIN HCL 500 MG PO TABS: 500.0000 mg | ORAL_TABLET | Freq: Two times a day (BID) | ORAL | 0 refills | Status: DC

## 2018-08-16 MED ORDER — HYDROMORPHONE HCL 4 MG PO TABS: 4.0000 mg | ORAL_TABLET | ORAL | 0 refills | Status: DC | PRN

## 2018-08-16 MED ORDER — ONDANSETRON HCL 4 MG/2ML IJ SOLN
4.0000 mg | Freq: Once | INTRAMUSCULAR | Status: AC
  Administered 2018-08-16: 4 mg via INTRAVENOUS
  Filled 2018-08-16: qty 2

## 2018-08-16 NOTE — ED Notes (Signed)
Patient returned from CT

## 2018-08-16 NOTE — ED Notes (Signed)
Patient transported to CT 

## 2018-08-16 NOTE — ED Triage Notes (Signed)
Pt from home with c/o right flank pain that woke him from his sleep. Pt states he is able to urinate and denies blood in his urine. Pt is afebrile. Pt has hx of kidney stones.

## 2018-08-16 NOTE — ED Provider Notes (Signed)
[facility] DEPT Provider Note: Georgena Spurling, MD, FACEP  CSN: [id number] MRN: [id number] ARRIVAL: 08/16/18 at [place]: [place]  Flank Pain (right)   HISTORY OF PRESENT ILLNESS  08/16/18 3:21 AM Stephen Gill is a 62 y.o. male with a history of kidney stones.  He is here with right flank pain that began about 1:59 AM today.  He rates the pain as a 10 out of 10 and describes it as being like previous kidney stones.  Pain is worse with movement or palpation.  The pain radiates to his right lower quadrant.  He has had associated nausea and vomiting but no hematuria or dysuria.   Past Medical History:  Diagnosis Date  . Calcaneus fracture, right   . Kidney stone     Past Surgical History:  Procedure Laterality Date  . ORIF CALCANEOUS FRACTURE Right 04/25/2015   Procedure: OPEN REDUCTION INTERNAL FIXATION (ORIF) RIGHT CALCANEOUS FRACTURE;  Surgeon: Newt Minion, MD;  Location: [facility];  Service: Orthopedics;  Laterality: Right;  . URETEROSCOPY      Family History  Problem Relation Age of Onset  . Hypertension Mother   . Heart attack Brother     Social History   Tobacco Use  . Smoking status: Never Smoker  . Smokeless tobacco: Never Used  Substance Use Topics  . Alcohol use: No  . Drug use: No    Prior to Admission medications   Not on File    Allergies Patient has no known allergies.   REVIEW OF SYSTEMS  Negative except as noted here or in the History of Present Illness.   PHYSICAL EXAMINATION  Initial Vital Signs Blood pressure 134/72, pulse 77, temperature 97.8 F (36.6 C), temperature source Oral, resp. rate 16, SpO2 100 %.  Examination General: Well-developed, well-nourished male in no acute distress; appearance consistent with age of record HENT: normocephalic; atraumatic Eyes: pupils equal, round and reactive to light; extraocular muscles intact Neck: supple Heart: regular rate and rhythm; Lungs: clear to auscultation  bilaterally Abdomen: soft; nondistended; right lower quadrant tenderness; no masses or hepatosplenomegaly; bowel sounds present GU: Right CVA tenderness Extremities: No deformity; full range of motion; pulses normal Neurologic: Awake, alert; motor function intact in all extremities and symmetric; no facial droop Skin: Warm and dry Psychiatric: Grimacing   RESULTS  Summary of this visit's results, reviewed by myself:   EKG Interpretation  Date/Time:    Ventricular Rate:    PR Interval:    QRS Duration:   QT Interval:    QTC Calculation:   R Axis:     Text Interpretation:        Laboratory Studies: Results for orders placed or performed during the hospital encounter of 08/16/18 (from the past 24 hour(s))  Urinalysis, Routine w reflex microscopic- may I&O cath if menses     Status: Abnormal   Collection Time: 08/16/18  4:56 AM  Result Value Ref Range   Color, Urine YELLOW YELLOW   APPearance CLEAR CLEAR   Specific Gravity, Urine 1.027 1.005 - 1.030   pH 6.0 5.0 - 8.0   Glucose, UA >=500 (A) NEGATIVE mg/dL   Hgb urine dipstick LARGE (A) NEGATIVE   Bilirubin Urine NEGATIVE NEGATIVE   Ketones, ur 5 (A) NEGATIVE mg/dL   Protein, ur 30 (A) NEGATIVE mg/dL   Nitrite NEGATIVE NEGATIVE   Leukocytes, UA TRACE (A) NEGATIVE   RBC / HPF 21-50 0 - 5 RBC/hpf   WBC, UA 6-10 0 -  5 WBC/hpf   Bacteria, UA RARE (A) NONE SEEN   Mucus PRESENT    Imaging Studies: Ct Renal Stone Study  Result Date: 08/16/2018 CLINICAL DATA:  Right flank pain. EXAM: CT ABDOMEN AND PELVIS WITHOUT CONTRAST TECHNIQUE: Multidetector CT imaging of the abdomen and pelvis was performed following the standard protocol without IV contrast. COMPARISON:  None. FINDINGS: Lower chest: Lung bases are clear. Hepatobiliary: No focal liver abnormality is seen. No gallstones, gallbladder wall thickening, or biliary dilatation. Pancreas: Unremarkable. No pancreatic ductal dilatation or surrounding inflammatory changes. Spleen:  Normal in size without focal abnormality. Adrenals/Urinary Tract: No adrenal gland nodules. Multiple bilateral intrarenal stones. 4 mm stone in the right mid ureter at the level of L4-5. Moderate proximal hydronephrosis and hydroureter with stranding around the ureter. No hydronephrosis or hydroureter on the left. Bladder is unremarkable. Stomach/Bowel: Stomach, small bowel, and colon are not abnormally distended. No wall thickening or inflammatory changes are suggested. Appendix is normal. Vascular/Lymphatic: No significant vascular findings are present. No enlarged abdominal or pelvic lymph nodes. Reproductive: Prostate is unremarkable. Other: No abdominal wall hernia or abnormality. No abdominopelvic ascites. Musculoskeletal: No acute or significant osseous findings. IMPRESSION: 1. 4 mm stone in the right mid ureter with moderate proximal obstruction. 2. Multiple bilateral nonobstructing intrarenal stones. Electronically Signed   By: Lucienne Capers M.D.   On: 08/16/2018 04:16    ED COURSE and MDM  Nursing notes and initial vitals signs, including pulse oximetry, reviewed.  Vitals:   08/16/18 0318 08/16/18 0426 08/16/18 0500  BP: 134/72 135/85 133/87  Pulse: 77 64 71  Resp: 16    Temp: 97.8 F (36.6 C)    TempSrc: Oral    SpO2: 100% 95% 95%   5:12 AM Pain well controlled with IV Dilaudid.  Urinalysis suggest early urinary tract infection.  Urine sent for culture, will treat with Cipro.  Patient afebrile in ED.  PROCEDURES    ED DIAGNOSES     ICD-10-CM   1. Ureterolithiasis N20.1        Jamia Hoban, MD 08/16/18 [phone]

## 2018-08-16 NOTE — ED Notes (Signed)
Pt. Made aware for the need of urine specimen. 

## 2018-08-17 LAB — URINE CULTURE: Culture: NO GROWTH

## 2019-02-07 ENCOUNTER — Other Ambulatory Visit: Payer: Self-pay

## 2019-02-07 ENCOUNTER — Encounter (HOSPITAL_COMMUNITY): Payer: Self-pay

## 2019-02-07 ENCOUNTER — Ambulatory Visit (HOSPITAL_COMMUNITY)
Admission: EM | Admit: 2019-02-07 | Discharge: 2019-02-07 | Disposition: A | Discharge status: Home / Self Care | Payer: BLUE CROSS/BLUE SHIELD | Attending: Family Medicine | Admitting: Family Medicine

## 2019-02-07 DIAGNOSIS — S20462A Insect bite (nonvenomous) of left back wall of thorax, initial encounter: Secondary | ICD-10-CM | POA: Diagnosis not present

## 2019-02-07 DIAGNOSIS — W57XXXA Bitten or stung by nonvenomous insect and other nonvenomous arthropods, initial encounter: Secondary | ICD-10-CM | POA: Diagnosis not present

## 2019-02-07 MED ORDER — TRIAMCINOLONE ACETONIDE 0.1 % EX CREA
1.0000 | TOPICAL_CREAM | Freq: Two times a day (BID) | CUTANEOUS | 0 refills | Status: DC
Start: 2019-02-07 — End: 2019-07-25

## 2019-02-07 NOTE — Discharge Instructions (Signed)
This looks like an allergic response to a bug bite.  I do not see any obvious infection.  Please used provided cream to help with the itching and rash.  Cleanse daily with soap and water.  Avoid picking at it to allow it to heal.  If develop increased pain, redness, swelling, or drainage please return to be seen.

## 2019-02-07 NOTE — ED Triage Notes (Signed)
Pt states has a bug bite to lt upper back/shoulder

## 2019-02-07 NOTE — ED Provider Notes (Signed)
[facility]    CSN: [id number] Arrival date & time: 02/07/19  1905     History   Chief Complaint Chief Complaint  Patient presents with  . Insect Bite    HPI Stephen Gill is a 63 y.o. male.   Stephen Gill presents with complaints of itching area to left mid back after a bug bite approximately 2 weeks ago. States he was cutting grass and he felt what seemed to be like a bee sting, and has had irritation to the area ever since. He feels it has gotten larger. It itches. Not necessarily painful. His S/O picked the scab off last night to evaluate for tick etc. Has not had any drainage or pus. No fevers. Hasn't tried any treatments for symptoms. No radiating pain. Without contributing medical history.     ROS per HPI, negative if not otherwise mentioned.      Past Medical History:  Diagnosis Date  . Calcaneus fracture, right   . Kidney stone     There are no active problems to display for this patient.   Past Surgical History:  Procedure Laterality Date  . ORIF CALCANEOUS FRACTURE Right 04/25/2015   Procedure: OPEN REDUCTION INTERNAL FIXATION (ORIF) RIGHT CALCANEOUS FRACTURE;  Surgeon: Newt Minion, MD;  Location: [facility];  Service: Orthopedics;  Laterality: Right;  . URETEROSCOPY         Home Medications    Prior to Admission medications   Medication Sig Start Date End Date Taking? Authorizing Provider  triamcinolone cream (KENALOG) 0.1 % Apply 1 application topically 2 (two) times daily. 02/07/19   Zigmund Gottron, NP    Family History Family History  Problem Relation Age of Onset  . Hypertension Mother   . Heart attack Brother     Social History Social History   Tobacco Use  . Smoking status: Never Smoker  . Smokeless tobacco: Never Used  Substance Use Topics  . Alcohol use: No  . Drug use: No     Allergies   Patient has no known allergies.   Review of Systems Review of Systems   Physical Exam Triage Vital Signs ED Triage Vitals   Enc Vitals Group     BP 02/07/19 1918 (!) 164/98     Pulse Rate 02/07/19 1918 81     Resp 02/07/19 1918 18     Temp 02/07/19 1918 (!) 97.4 F (36.3 C)     Temp Source 02/07/19 1918 Oral     SpO2 02/07/19 1918 96 %     Weight --      Height --      Head Circumference --      Peak Flow --      Pain Score 02/07/19 1919 0     Pain Loc --      Pain Edu? --      Excl. in [place]? --    No data found.  Updated Vital Signs BP (!) 164/98 (BP Location: Right Arm)   Pulse 81   Temp (!) 97.4 F (36.3 C) (Oral)   Resp 18   SpO2 96%    Physical Exam Constitutional:      Appearance: He is well-developed.  Cardiovascular:     Rate and Rhythm: Normal rate and regular rhythm.  Pulmonary:     Effort: Pulmonary effort is normal.     Breath sounds: Normal breath sounds.  Skin:    General: Skin is warm and dry.  Comments: Approximately 2.5 cm in diameter raised red splotchy lesion with central scab; non tender; no drainage, no fluctuance; see photos   Neurological:     Mental Status: He is alert and oriented to person, place, and time.          UC Treatments / Results  Labs (all labs ordered are listed, but only abnormal results are displayed) Labs Reviewed - No data to display  EKG None  Radiology No results found.  Procedures Procedures (including critical care time)  Medications Ordered in UC Medications - No data to display  Initial Impression / Assessment and Plan / UC Course  I have reviewed the triage vital signs and the nursing notes.  Pertinent labs & imaging results that were available during my care of the patient were reviewed by me and considered in my medical decision making (see chart for details).     Appears consistent with allergic response to bug bite. No indication of cellulitis or abscess. No indication of shingles at this time. Topical kenalog provided. Return precautions provided. Patient verbalized understanding and agreeable to plan.    Final Clinical Impressions(s) / UC Diagnoses   Final diagnoses:  Insect bite of left back wall of thorax, initial encounter     Discharge Instructions     This looks like an allergic response to a bug bite.  I do not see any obvious infection.  Please used provided cream to help with the itching and rash.  Cleanse daily with soap and water.  Avoid picking at it to allow it to heal.  If develop increased pain, redness, swelling, or drainage please return to be seen.     ED Prescriptions    Medication Sig Dispense Auth. Provider   triamcinolone cream (KENALOG) 0.1 % Apply 1 application topically 2 (two) times daily. 30 g Zigmund Gottron, NP     Controlled Substance Prescriptions [place] Controlled Substance Registry consulted? Not Applicable   Zigmund Gottron, NP 02/07/19 1947

## 2019-07-25 ENCOUNTER — Encounter (HOSPITAL_COMMUNITY): Payer: Self-pay | Admitting: General Practice

## 2019-07-25 ENCOUNTER — Emergency Department (HOSPITAL_COMMUNITY): Payer: BC Managed Care – PPO

## 2019-07-25 ENCOUNTER — Other Ambulatory Visit: Payer: Self-pay | Admitting: Urology

## 2019-07-25 ENCOUNTER — Encounter (HOSPITAL_COMMUNITY): Payer: Self-pay

## 2019-07-25 ENCOUNTER — Other Ambulatory Visit: Payer: Self-pay

## 2019-07-25 ENCOUNTER — Emergency Department (HOSPITAL_COMMUNITY)
Admission: EM | Admit: 2019-07-25 | Discharge: 2019-07-25 | Disposition: A | Discharge status: Home / Self Care | Payer: BC Managed Care – PPO | Attending: Emergency Medicine | Admitting: Emergency Medicine

## 2019-07-25 DIAGNOSIS — N201 Calculus of ureter: Secondary | ICD-10-CM | POA: Insufficient documentation

## 2019-07-25 DIAGNOSIS — Z7984 Long term (current) use of oral hypoglycemic drugs: Secondary | ICD-10-CM | POA: Insufficient documentation

## 2019-07-25 DIAGNOSIS — R1012 Left upper quadrant pain: Secondary | ICD-10-CM | POA: Diagnosis present

## 2019-07-25 LAB — URINALYSIS, ROUTINE W REFLEX MICROSCOPIC
Bilirubin Urine: NEGATIVE
Glucose, UA: >=500 mg/dL — AB
Ketones, ur: NEGATIVE mg/dL
Leukocytes,Ua: NEGATIVE
Nitrite: NEGATIVE
Protein, ur: 30 mg/dL — AB
RBC / HPF: >50 RBC/hpf — ABNORMAL HIGH (ref 0–5)
Specific Gravity, Urine: 1.017 (ref 1.005–1.030)
pH: 5 (ref 5.0–8.0)

## 2019-07-25 LAB — CBC
HCT: 42.8 % (ref 39.0–52.0)
Hemoglobin: 13.5 g/dL (ref 13.0–17.0)
MCH: 29.8 pg (ref 26.0–34.0)
MCHC: 31.5 g/dL (ref 30.0–36.0)
MCV: 94.5 fL (ref 80.0–100.0)
Platelets: 172 10*3/uL (ref 150–400)
RBC: 4.53 MIL/uL (ref 4.22–5.81)
RDW: 12.4 % (ref 11.5–15.5)
WBC: 7.3 10*3/uL (ref 4.0–10.5)
nRBC: 0 % (ref 0.0–0.2)

## 2019-07-25 LAB — BASIC METABOLIC PANEL
Anion gap: 9 (ref 5–15)
BUN: 26 mg/dL — ABNORMAL HIGH (ref 8–23)
CO2: 27 mmol/L (ref 22–32)
Calcium: 9.3 mg/dL (ref 8.9–10.3)
Chloride: 103 mmol/L (ref 98–111)
Creatinine, Ser: 1.55 mg/dL — ABNORMAL HIGH (ref 0.61–1.24)
GFR calc Af Amer: 55 mL/min — ABNORMAL LOW (ref >60)
GFR calc non Af Amer: 48 mL/min — ABNORMAL LOW (ref >60)
Glucose, Bld: 202 mg/dL — ABNORMAL HIGH (ref 70–99)
Potassium: 3.8 mmol/L (ref 3.5–5.1)
Sodium: 139 mmol/L (ref 135–145)

## 2019-07-25 MED ORDER — ONDANSETRON HCL 4 MG/2ML IJ SOLN
4.0000 mg | Freq: Once | INTRAMUSCULAR | Status: AC
  Administered 2019-07-25: 05:00:00 4 mg via INTRAVENOUS
  Filled 2019-07-25: qty 2

## 2019-07-25 MED ORDER — HYDROMORPHONE HCL 4 MG PO TABS: 2.0000 mg | ORAL_TABLET | ORAL | 0 refills | Status: DC | PRN

## 2019-07-25 MED ORDER — TAMSULOSIN HCL 0.4 MG PO CAPS
0.4000 mg | ORAL_CAPSULE | Freq: Once | ORAL | Status: AC
  Administered 2019-07-25: 07:00:00 0.4 mg via ORAL
  Filled 2019-07-25: qty 1

## 2019-07-25 MED ORDER — HYDROMORPHONE HCL 1 MG/ML IJ SOLN
1.0000 mg | Freq: Once | INTRAMUSCULAR | Status: AC
  Administered 2019-07-25: 05:00:00 1 mg via INTRAVENOUS
  Filled 2019-07-25: qty 1

## 2019-07-25 MED ORDER — TAMSULOSIN HCL 0.4 MG PO CAPS: ORAL_CAPSULE | ORAL | 0 refills | Status: DC

## 2019-07-25 MED ORDER — ONDANSETRON 8 MG PO TBDP: 8.0000 mg | ORAL_TABLET | Freq: Three times a day (TID) | ORAL | 1 refills | Status: DC | PRN

## 2019-07-25 NOTE — ED Notes (Signed)
Pt provided with 2 warm blankets.  

## 2019-07-25 NOTE — ED Triage Notes (Signed)
Pt c/o of left flank pain that started at 0330. Pt states it feels like previous times when he had a kidney stone. Pt reports N/V x1 episode.

## 2019-07-25 NOTE — ED Notes (Signed)
EDP at bedside  

## 2019-07-25 NOTE — ED Provider Notes (Signed)
[facility] DEPT Provider Note: Georgena Spurling, MD, FACEP  CSN: [id number] MRN: [id number] ARRIVAL: 07/25/19 at 0338 ROOM: WA11/WA11   CHIEF COMPLAINT  Flank Pain   HISTORY OF PRESENT ILLNESS  07/25/19 4:40 AM Stephen Gill is a 63 y.o. male with a history of kidney stones.  He is here with left flank pain that began about 330 this morning.  It is characterized as like previous kidney stones.  He rates it as a 10 out of 10, not significantly changed with movement but worse with palpation.  He has had nausea and one episode of vomiting with this.  He denies hematuria.   Past Medical History:  Diagnosis Date  . Calcaneus fracture, right   . Kidney stone     Past Surgical History:  Procedure Laterality Date  . ORIF CALCANEOUS FRACTURE Right 04/25/2015   Procedure: OPEN REDUCTION INTERNAL FIXATION (ORIF) RIGHT CALCANEOUS FRACTURE;  Surgeon: Newt Minion, MD;  Location: [facility];  Service: Orthopedics;  Laterality: Right;  . URETEROSCOPY      Family History  Problem Relation Age of Onset  . Hypertension Mother   . Heart attack Brother     Social History   Tobacco Use  . Smoking status: Never Smoker  . Smokeless tobacco: Never Used  Substance Use Topics  . Alcohol use: No  . Drug use: No    Prior to Admission medications   Medication Sig Start Date End Date Taking? Authorizing Provider  metFORMIN (GLUCOPHAGE) 500 MG tablet Take 500 mg by mouth daily. 06/04/19  Yes [provider]  HYDROmorphone (DILAUDID) 4 MG tablet Take 0.5-1 tablets (2-4 mg total) by mouth every 4 (four) hours as needed for severe pain. 07/25/19   Ruhi Kopke, MD  ondansetron (ZOFRAN ODT) 8 MG disintegrating tablet Take 1 tablet (8 mg total) by mouth every 8 (eight) hours as needed. 07/25/19   Nyajah Hyson, MD  tamsulosin (FLOMAX) 0.4 MG CAPS capsule Take 1 tablet daily until stone passes. 07/25/19   Carlester Kasparek, MD    Allergies Patient has no known allergies.   REVIEW OF SYSTEMS   Negative except as noted here or in the History of Present Illness.   PHYSICAL EXAMINATION  Initial Vital Signs Blood pressure (!) 142/117, pulse 68, temperature 97.9 F (36.6 C), temperature source Oral, resp. rate 16, height 6\' 3"  (1.905 m), weight 102.1 kg, SpO2 97 %.  Examination General: Well-developed, well-nourished male in no acute distress; appearance consistent with age of record HENT: normocephalic; atraumatic Eyes: pupils equal, round and reactive to light; extraocular muscles intact Neck: supple Heart: regular rate and rhythm Lungs: clear to auscultation bilaterally Abdomen: soft; nondistended; nontender; bowel sounds present GU: Left CVA tenderness Extremities: No deformity; full range of motion; pulses normal Neurologic: Awake, alert and oriented; motor function intact in all extremities and symmetric; no facial droop Skin: Warm and dry Psychiatric: Grimacing   RESULTS  Summary of this visit's results, reviewed and interpreted by myself:   EKG Interpretation  Date/Time:    Ventricular Rate:    PR Interval:    QRS Duration:   QT Interval:    QTC Calculation:   R Axis:     Text Interpretation:        Laboratory Studies: Results for orders placed or performed during the hospital encounter of 07/25/19 (from the past 24 hour(s))  CBC     Status: None   Collection Time: 07/25/19  4:43 AM  Result Value Ref Range   WBC  7.3 4.0 - 10.5 K/uL   RBC 4.53 4.22 - 5.81 MIL/uL   Hemoglobin 13.5 13.0 - 17.0 g/dL   HCT 42.8 39.0 - 52.0 %   MCV 94.5 80.0 - 100.0 fL   MCH 29.8 26.0 - 34.0 pg   MCHC 31.5 30.0 - 36.0 g/dL   RDW 12.4 11.5 - 15.5 %   Platelets 172 150 - 400 K/uL   nRBC 0.0 0.0 - 0.2 %  Basic metabolic panel     Status: Abnormal   Collection Time: 07/25/19  4:43 AM  Result Value Ref Range   Sodium 139 135 - 145 mmol/L   Potassium 3.8 3.5 - 5.1 mmol/L   Chloride 103 98 - 111 mmol/L   CO2 27 22 - 32 mmol/L   Glucose, Bld 202 (H) 70 - 99 mg/dL   BUN  26 (H) 8 - 23 mg/dL   Creatinine, Ser 1.55 (H) 0.61 - 1.24 mg/dL   Calcium 9.3 8.9 - 10.3 mg/dL   GFR calc non Af Amer 48 (L) >60 mL/min   GFR calc Af Amer 55 (L) >60 mL/min   Anion gap 9 5 - 15  Urinalysis, Routine w reflex microscopic     Status: Abnormal   Collection Time: 07/25/19  6:40 AM  Result Value Ref Range   Color, Urine YELLOW YELLOW   APPearance CLEAR CLEAR   Specific Gravity, Urine 1.017 1.005 - 1.030   pH 5.0 5.0 - 8.0   Glucose, UA >=500 (A) NEGATIVE mg/dL   Hgb urine dipstick LARGE (A) NEGATIVE   Bilirubin Urine NEGATIVE NEGATIVE   Ketones, ur NEGATIVE NEGATIVE mg/dL   Protein, ur 30 (A) NEGATIVE mg/dL   Nitrite NEGATIVE NEGATIVE   Leukocytes,Ua NEGATIVE NEGATIVE   RBC / HPF >50 (H) 0 - 5 RBC/hpf   WBC, UA 0-5 0 - 5 WBC/hpf   Bacteria, UA RARE (A) NONE SEEN   Squamous Epithelial / LPF 0-5 0 - 5   Mucus PRESENT    Hyaline Casts, UA PRESENT    Imaging Studies: Ct Renal Stone Study  Result Date: 07/25/2019 CLINICAL DATA:  Left flank pain this morning. History of kidney stones. EXAM: CT ABDOMEN AND PELVIS WITHOUT CONTRAST TECHNIQUE: Multidetector CT imaging of the abdomen and pelvis was performed following the standard protocol without IV contrast. COMPARISON:  08/16/2018 FINDINGS: Lower chest: Streaky dependent subpleural atelectasis but no infiltrates or effusions. No worrisome pulmonary lesions. The heart is normal in size. No pericardial effusion. Hepatobiliary: No focal hepatic lesions or intrahepatic biliary dilatation. The gallbladder is unremarkable. No common bile duct dilatation. Pancreas: No mass, inflammation or ductal dilatation. Spleen: Normal size.  No focal lesions. Adrenals/Urinary Tract: The adrenal glands are normal. Small bilateral renal calculi are noted. Moderate left-sided hydroureteronephrosis down to an obstructing 5 x 7 mm calculus located at the L3-4 level. No more distal ureteral calculi and no right-sided ureteral calculi. No worrisome renal or  bladder lesions. Stomach/Bowel: The stomach, duodenum, small bowel and colon are grossly normal without oral contrast. No inflammatory changes, mass lesions or obstructive findings. The terminal ileum and appendix are normal. Vascular/Lymphatic: The aorta is normal in caliber. No atheroscerlotic calcifications. No mesenteric of retroperitoneal mass or adenopathy. Small scattered lymph nodes are noted. Reproductive: The prostate gland and seminal vesicles are unremarkable. The prostate gland is mildly enlarged. Other: No pelvic mass or adenopathy. No free pelvic fluid collections. No inguinal mass or adenopathy. No abdominal wall hernia or subcutaneous lesions. Musculoskeletal: No significant bony findings. IMPRESSION:  1. 5 x 7 mm obstructing left mid ureteral calculus with moderate left-sided hydroureteronephrosis. 2. Small bilateral renal calculi. 3. No other significant abdominal/pelvic findings. Electronically Signed   By: Marijo Sanes M.D.   On: 07/25/2019 05:31    ED COURSE and MDM  Nursing notes, initial and subsequent vitals signs, including pulse oximetry, reviewed and interpreted by myself.  Vitals:   07/25/19 0416 07/25/19 0553 07/25/19 0600 07/25/19 0630  BP: (!) 142/117 115/84 111/80 111/84  Pulse: 68 61 64 (!) 58  Resp: 16 16  16   Temp: 97.9 F (36.6 C)     TempSrc: Oral     SpO2: 97% 96% 95% 95%  Weight: 102.1 kg     Height: 6\' 3"  (1.905 m)      Medications  ondansetron (ZOFRAN) injection 4 mg (4 mg Intravenous Given 07/25/19 0459)  HYDROmorphone (DILAUDID) injection 1 mg (1 mg Intravenous Given 07/25/19 0459)  tamsulosin (FLOMAX) capsule 0.4 mg (0.4 mg Oral Given 07/25/19 0638)   6:02 AM Pain adequately controlled with IV Dilaudid.  Awaiting urine specimen.  7:14 AM Patient advised of CT findings showing stone that will likely require urologic intervention.  He was advised to contact urology today for follow-up.  PROCEDURES  Procedures   ED DIAGNOSES     ICD-10-CM    1. Ureterolithiasis  N20.1        Samariah Hokenson, MD 07/25/19 [phone]

## 2019-07-28 ENCOUNTER — Ambulatory Visit (HOSPITAL_COMMUNITY)
Admission: RE | Admit: 2019-07-28 | Discharge: 2019-07-28 | Disposition: A | Discharge status: Home / Self Care | Payer: BC Managed Care – PPO | Attending: Urology | Admitting: Urology

## 2019-07-28 ENCOUNTER — Ambulatory Visit (HOSPITAL_COMMUNITY): Payer: BC Managed Care – PPO

## 2019-07-28 ENCOUNTER — Encounter (HOSPITAL_COMMUNITY): Payer: Self-pay | Admitting: General Practice

## 2019-07-28 ENCOUNTER — Encounter (HOSPITAL_COMMUNITY)
Admission: RE | Disposition: A | Discharge status: Home / Self Care | Payer: Self-pay | Source: Home / Self Care | Attending: Urology

## 2019-07-28 DIAGNOSIS — E119 Type 2 diabetes mellitus without complications: Secondary | ICD-10-CM | POA: Insufficient documentation

## 2019-07-28 DIAGNOSIS — N200 Calculus of kidney: Secondary | ICD-10-CM | POA: Diagnosis not present

## 2019-07-28 DIAGNOSIS — N201 Calculus of ureter: Secondary | ICD-10-CM | POA: Diagnosis not present

## 2019-07-28 HISTORY — DX: Type 2 diabetes mellitus without complications: E11.9

## 2019-07-28 HISTORY — PX: EXTRACORPOREAL SHOCK WAVE LITHOTRIPSY: SHX1557

## 2019-07-28 HISTORY — DX: Personal history of urinary calculi: Z87.442

## 2019-07-28 LAB — GLUCOSE, CAPILLARY: Glucose-Capillary: 157 mg/dL — ABNORMAL HIGH (ref 70–99)

## 2019-07-28 SURGERY — LITHOTRIPSY, ESWL
Anesthesia: LOCAL | Laterality: Left

## 2019-07-28 MED ORDER — DIPHENHYDRAMINE HCL 25 MG PO CAPS
25.0000 mg | ORAL_CAPSULE | ORAL | Status: AC
  Administered 2019-07-28: 25 mg via ORAL
  Filled 2019-07-28: qty 1

## 2019-07-28 MED ORDER — SODIUM CHLORIDE 0.9 % IV SOLN
INTRAVENOUS | Status: DC
  Administered 2019-07-28: 11:00:00 via INTRAVENOUS

## 2019-07-28 MED ORDER — DIAZEPAM 5 MG PO TABS
10.0000 mg | ORAL_TABLET | ORAL | Status: AC
  Administered 2019-07-28: 12:00:00 10 mg via ORAL
  Filled 2019-07-28: qty 2

## 2019-07-28 MED ORDER — CIPROFLOXACIN HCL 500 MG PO TABS
500.0000 mg | ORAL_TABLET | ORAL | Status: AC
  Administered 2019-07-28: 12:00:00 500 mg via ORAL
  Filled 2019-07-28: qty 1

## 2019-07-28 NOTE — H&P (Signed)
See [facility] OP note scanned into chart. Also because of the size, density, location and other factors that cannot be anticipated I feel this will likely be a staged procedure. This fact supersedes any indication in the scanned [facility] operative note to the contrary.  

## 2019-07-28 NOTE — Discharge Instructions (Signed)
Lithotripsy, Care After °This sheet gives you information about how to care for yourself after your procedure. Your health care provider may also give you more specific instructions. If you have problems or questions, contact your health care provider. °What can I expect after the procedure? °After the procedure, it is common to have: °· Some blood in your urine. This should only last for a few days. °· Soreness in your back, sides, or upper abdomen for a few days. °· Blotches or bruises on your back where the pressure wave entered the skin. °· Pain, discomfort, or nausea when pieces (fragments) of the kidney stone move through the tube that carries urine from the kidney to the bladder (ureter). Stone fragments may pass soon after the procedure, but they may continue to pass for up to 4-8 weeks. °? If you have severe pain or nausea, contact your health care provider. This may be caused by a large stone that was not broken up, and this may mean that you need more treatment. °· Some pain or discomfort during urination. °· Some pain or discomfort in the lower abdomen or (in men) at the base of the penis. °Follow these instructions at home: °Medicines °· Take over-the-counter and prescription medicines only as told by your health care provider. °· If you were prescribed an antibiotic medicine, take it as told by your health care provider. Do not stop taking the antibiotic even if you start to feel better. °· Do not drive for 24 hours if you were given a medicine to help you relax (sedative). °· Do not drive or use heavy machinery while taking prescription pain medicine. °Eating and drinking ° °  ° °· Drink enough water and fluids to keep your urine clear or pale yellow. This helps any remaining pieces of the stone to pass. It can also help prevent new stones from forming. °· Eat plenty of fresh fruits and vegetables. °· Follow instructions from your health care provider about eating and drinking restrictions. You may be  instructed: °? To reduce how much salt (sodium) you eat or drink. Check ingredients and nutrition facts on packaged foods and beverages. °? To reduce how much meat you eat. °· Eat the recommended amount of calcium for your age and gender. Ask your health care provider how much calcium you should have. °General instructions °· Get plenty of rest. °· Most people can resume normal activities 1-2 days after the procedure. Ask your health care provider what activities are safe for you. °· Your health care provider may direct you to lie in a certain position (postural drainage) and tap firmly (percuss) over your kidney area to help stone fragments pass. Follow instructions as told by your health care provider. °· If directed, strain all urine through the strainer that was provided by your health care provider. °? Keep all fragments for your health care provider to see. Any stones that are found may be sent to a medical lab for examination. The stone may be as small as a grain of salt. °· Keep all follow-up visits as told by your health care provider. This is important. °Contact a health care provider if: °· You have pain that is severe or does not get better with medicine. °· You have nausea that is severe or does not go away. °· You have blood in your urine longer than your health care provider told you to expect. °· You have more blood in your urine. °· You have pain during urination that does   not go away.  You urinate more frequently than usual and this does not go away.  You develop a rash or any other possible signs of an allergic reaction. Get help right away if:  You have severe pain in your back, sides, or upper abdomen.  You have severe pain while urinating.  Your urine is very dark red.  You have blood in your stool (feces).  You cannot pass any urine at all.  You feel a strong urge to urinate after emptying your bladder.  You have a fever or chills.  You develop shortness of breath,  difficulty breathing, or chest pain.  You have severe nausea that leads to persistent vomiting.  You faint. Summary  After this procedure, it is common to have some pain, discomfort, or nausea when pieces (fragments) of the kidney stone move through the tube that carries urine from the kidney to the bladder (ureter). If this pain or nausea is severe, however, you should contact your health care provider.  Most people can resume normal activities 1-2 days after the procedure. Ask your health care provider what activities are safe for you.  Drink enough water and fluids to keep your urine clear or pale yellow. This helps any remaining pieces of the stone to pass, and it can help prevent new stones from forming.  If directed, strain your urine and keep all fragments for your health care provider to see. Fragments or stones may be as small as a grain of salt.  Get help right away if you have severe pain in your back, sides, or upper abdomen or have severe pain while urinating. This information is not intended to replace advice given to you by your health care provider. Make sure you discuss any questions you have with your health care provider. Document Released: 10/01/2007 Document Revised: 12/23/2018 Document Reviewed: 08/02/2016 Elsevier Patient Education  2020 [place]. Moderate Conscious Sedation, Adult, Care After These instructions provide you with information about caring for yourself after your procedure. Your health care provider may also give you more specific instructions. Your treatment has been planned according to current medical practices, but problems sometimes occur. Call your health care provider if you have any problems or questions after your procedure. What can I expect after the procedure? After your procedure, it is common:  To feel sleepy for several hours.  To feel clumsy and have poor balance for several hours.  To have poor judgment for several hours.  To  vomit if you eat too soon. Follow these instructions at home: For at least 24 hours after the procedure:   Do not: ? Participate in activities where you could fall or become injured. ? Drive. ? Use heavy machinery. ? Drink alcohol. ? Take sleeping pills or medicines that cause drowsiness. ? Make important decisions or sign legal documents. ? Take care of children on your own.  Rest. Eating and drinking  Follow the diet recommended by your health care provider.  If you vomit: ? Drink water, juice, or soup when you can drink without vomiting. ? Make sure you have little or no nausea before eating solid foods. General instructions  Have a responsible adult stay with you until you are awake and alert.  Take over-the-counter and prescription medicines only as told by your health care provider.  If you smoke, do not smoke without supervision.  Keep all follow-up visits as told by your health care provider. This is important. Contact a health care provider if:  You  keep feeling nauseous or you keep vomiting.  You feel light-headed.  You develop a rash.  You have a fever. Get help right away if:  You have trouble breathing. This information is not intended to replace advice given to you by your health care provider. Make sure you discuss any questions you have with your health care provider. Document Released: 07/02/2013 Document Revised: 08/24/2017 Document Reviewed: 01/01/2016 Elsevier Patient Education  2020 [employer].

## 2019-07-28 NOTE — Op Note (Signed)
See [facility] OP note scanned into chart. Also because of the size, density, location and other factors that cannot be anticipated I feel this will likely be a staged procedure. This fact supersedes any indication in the scanned [facility] operative note to the contrary.  

## 2019-07-29 ENCOUNTER — Encounter (HOSPITAL_COMMUNITY): Payer: Self-pay | Admitting: Urology

## 2019-10-25 ENCOUNTER — Emergency Department (HOSPITAL_COMMUNITY)
Admission: EM | Admit: 2019-10-25 | Discharge: 2019-10-25 | Disposition: A | Discharge status: Home / Self Care | Payer: BC Managed Care – PPO | Attending: Emergency Medicine | Admitting: Emergency Medicine

## 2019-10-25 ENCOUNTER — Encounter (HOSPITAL_COMMUNITY): Payer: Self-pay | Admitting: Emergency Medicine

## 2019-10-25 ENCOUNTER — Other Ambulatory Visit: Payer: Self-pay

## 2019-10-25 DIAGNOSIS — E119 Type 2 diabetes mellitus without complications: Secondary | ICD-10-CM | POA: Diagnosis not present

## 2019-10-25 DIAGNOSIS — B0231 Zoster conjunctivitis: Secondary | ICD-10-CM

## 2019-10-25 DIAGNOSIS — Z79899 Other long term (current) drug therapy: Secondary | ICD-10-CM | POA: Insufficient documentation

## 2019-10-25 DIAGNOSIS — Z7984 Long term (current) use of oral hypoglycemic drugs: Secondary | ICD-10-CM | POA: Insufficient documentation

## 2019-10-25 DIAGNOSIS — R21 Rash and other nonspecific skin eruption: Secondary | ICD-10-CM | POA: Diagnosis present

## 2019-10-25 MED ORDER — HYDROCODONE-ACETAMINOPHEN 5-325 MG PO TABS: 1.0000 | ORAL_TABLET | Freq: Four times a day (QID) | ORAL | 0 refills | Status: DC | PRN

## 2019-10-25 MED ORDER — TETRACAINE HCL 0.5 % OP SOLN
2.0000 [drp] | Freq: Once | OPHTHALMIC | Status: AC
  Administered 2019-10-25: 02:00:00 2 [drp] via OPHTHALMIC
  Filled 2019-10-25: qty 4

## 2019-10-25 MED ORDER — ERYTHROMYCIN 5 MG/GM OP OINT
TOPICAL_OINTMENT | Freq: Every day | OPHTHALMIC | Status: DC
  Administered 2019-10-25: 1 via OPHTHALMIC
  Filled 2019-10-25: qty 3.5

## 2019-10-25 MED ORDER — VALACYCLOVIR HCL 500 MG PO TABS
1000.0000 mg | ORAL_TABLET | Freq: Once | ORAL | Status: AC
  Administered 2019-10-25: 03:00:00 1000 mg via ORAL
  Filled 2019-10-25: qty 2

## 2019-10-25 MED ORDER — VALACYCLOVIR HCL 1 G PO TABS: 1000.0000 mg | ORAL_TABLET | Freq: Three times a day (TID) | ORAL | 0 refills | Status: AC

## 2019-10-25 MED ORDER — HYDROCODONE-ACETAMINOPHEN 5-325 MG PO TABS
2.0000 | ORAL_TABLET | Freq: Once | ORAL | Status: AC
  Administered 2019-10-25: 2 via ORAL
  Filled 2019-10-25: qty 2

## 2019-10-25 NOTE — ED Provider Notes (Signed)
[facility] DEPT Provider Note   CSN: [id number] Arrival date & time: 10/25/19  0020     History Chief Complaint  Patient presents with  . Herpes Zoster    Stephen Gill is a 64 y.o. male.  The history is provided by the patient.  Rash Location:  Head/neck Head/neck rash location:  Scalp and head Quality: blistering   Severity:  Moderate Onset quality:  Gradual Duration:  3 days Timing:  Constant Progression:  Worsening Chronicity:  New Relieved by:  None tried Worsened by:  Nothing Associated symptoms: fever and headaches   Patient with history of diabetes presents with rash to scalp.  He reports the past 3 days he has had a rash to his right side of his forehead and scalp He reports the rash is now into his eyes. He reports fevers and headache. He had otherwise been well.  He does not wear contact lenses No history of eye surgery     Past Medical History:  Diagnosis Date  . Calcaneus fracture, right   . Diabetes mellitus without complication ([place])   . History of kidney stones   . Kidney stone     There are no problems to display for this patient.   Past Surgical History:  Procedure Laterality Date  . EXTRACORPOREAL SHOCK WAVE LITHOTRIPSY Left 07/28/2019   Procedure: EXTRACORPOREAL SHOCK WAVE LITHOTRIPSY (ESWL);  Surgeon: Lucas Mallow, MD;  Location: [facility];  Service: Urology;  Laterality: Left;  . ORIF CALCANEOUS FRACTURE Right 04/25/2015   Procedure: OPEN REDUCTION INTERNAL FIXATION (ORIF) RIGHT CALCANEOUS FRACTURE;  Surgeon: Newt Minion, MD;  Location: [facility];  Service: Orthopedics;  Laterality: Right;  . URETEROSCOPY         Family History  Problem Relation Age of Onset  . Hypertension Mother   . Heart attack Brother     Social History   Tobacco Use  . Smoking status: Never Smoker  . Smokeless tobacco: Never Used  Substance Use Topics  . Alcohol use: No  . Drug use: No    Home Medications Prior to  Admission medications   Medication Sig Start Date End Date Taking? Authorizing Provider  HYDROmorphone (DILAUDID) 4 MG tablet Take 0.5-1 tablets (2-4 mg total) by mouth every 4 (four) hours as needed for severe pain. 07/25/19   Molpus, John, MD  metFORMIN (GLUCOPHAGE) 500 MG tablet Take 500 mg by mouth daily. 06/04/19   [provider]  ondansetron (ZOFRAN ODT) 8 MG disintegrating tablet Take 1 tablet (8 mg total) by mouth every 8 (eight) hours as needed. 07/25/19   Molpus, John, MD  tamsulosin (FLOMAX) 0.4 MG CAPS capsule Take 1 tablet daily until stone passes. 07/25/19   Molpus, John, MD    Allergies    Patient has no known allergies.  Review of Systems   Review of Systems  Constitutional: Positive for fever.  Eyes: Positive for redness and visual disturbance.  Skin: Positive for rash.  Neurological: Positive for headaches.  All other systems reviewed and are negative.   Physical Exam Updated Vital Signs BP (!) 143/91 (BP Location: Right Arm)   Pulse (!) 109   Temp 100.2 F (37.9 C) (Oral)   Resp 15   Ht 1.93 m (6\' 4" )   Wt 97.5 kg   SpO2 97%   BMI 26.17 kg/m   Physical Exam CONSTITUTIONAL: Well developed/well nourished HEAD: Normocephalic/atraumatic, see photo below EYES: EOMI/PERRL, rash is into the orbit.  There is conjunctival erythema,  no corneal hazing.  There is no fluorescein uptake.  No obvious foreign bodies visual acuity 20/70 ENMT: Mucous membranes moist NECK: supple no meningeal signs CV: S1/S2 noted, no murmurs/rubs/gallops noted LUNGS: Lungs are clear to auscultation bilaterally, no apparent distress ABDOMEN: soft, nontender NEURO: Pt is awake/alert/appropriate, moves all extremitiesx4.  No facial droop.   EXTREMITIES: pulses normal/equal, full ROM SKIN: warm, color normal PSYCH: no abnormalities of mood noted, alert and oriented to situation     ED Results / Procedures / Treatments   Labs (all labs ordered are listed, but only abnormal  results are displayed) Labs Reviewed - No data to display  EKG None  Radiology No results found.  Procedures Procedures   Medications Ordered in ED Medications  erythromycin ophthalmic ointment (has no administration in time range)  tetracaine (PONTOCAINE) 0.5 % ophthalmic solution 2 drop (2 drops Right Eye Given by Other 10/25/19 0206)    ED Course  I have reviewed the triage vital signs and the nursing notes.      MDM Rules/Calculators/A&P                      Patient with obvious herpes ophthalmicus.  No fluorescein uptake on exam.  No corneal hazing.  Pupils are equal and reactive. Discussed the case with Dr. Manuella Ghazi with ophthalmology. He recommends Valtrex 1 g 3 times daily for 10 days.  We can apply erythromycin every night.  He can see the patient in the next 2 to 3 days.   Patient otherwise stable in the ER Pain medication provided Discussed return precautions including increasing pain or vision loss over the next 48 to 72 hours Final Clinical Impression(s) / ED Diagnoses Final diagnoses:  Herpes zoster conjunctivitis    Rx / DC Orders ED Discharge Orders         Ordered    HYDROcodone-acetaminophen (NORCO/VICODIN) 5-325 MG tablet  Every 6 hours PRN     10/25/19 0221    valACYclovir (VALTREX) 1000 MG tablet  3 times daily     10/25/19 0221           Ripley Fraise, MD 10/25/19 [phone]

## 2019-10-25 NOTE — ED Triage Notes (Signed)
Pt c/o right eye pain and swelling with lesions weeping  Placed on airborn precautions d/t weeping lesions pt reports Hx of shingles

## 2019-10-25 NOTE — Discharge Instructions (Signed)
If you have increasing pain, vision disturbances in the next 2 to 3 days please return to the ER immediately

## 2019-10-26 ENCOUNTER — Emergency Department (HOSPITAL_COMMUNITY)
Admission: EM | Admit: 2019-10-26 | Discharge: 2019-10-27 | Disposition: A | Discharge status: Home / Self Care | Payer: BC Managed Care – PPO | Attending: Emergency Medicine | Admitting: Emergency Medicine

## 2019-10-26 ENCOUNTER — Encounter (HOSPITAL_COMMUNITY): Payer: Self-pay

## 2019-10-26 ENCOUNTER — Other Ambulatory Visit: Payer: Self-pay

## 2019-10-26 DIAGNOSIS — Z7984 Long term (current) use of oral hypoglycemic drugs: Secondary | ICD-10-CM | POA: Diagnosis not present

## 2019-10-26 DIAGNOSIS — B029 Zoster without complications: Secondary | ICD-10-CM | POA: Insufficient documentation

## 2019-10-26 DIAGNOSIS — E119 Type 2 diabetes mellitus without complications: Secondary | ICD-10-CM | POA: Insufficient documentation

## 2019-10-26 DIAGNOSIS — H5711 Ocular pain, right eye: Secondary | ICD-10-CM | POA: Diagnosis present

## 2019-10-26 MED ORDER — VALACYCLOVIR HCL 500 MG PO TABS
1000.0000 mg | ORAL_TABLET | Freq: Once | ORAL | Status: AC
  Administered 2019-10-27: 1000 mg via ORAL
  Filled 2019-10-26: qty 2

## 2019-10-26 MED ORDER — HYDROMORPHONE HCL 1 MG/ML IJ SOLN
1.0000 mg | Freq: Once | INTRAMUSCULAR | Status: AC
  Administered 2019-10-27: 1 mg via INTRAMUSCULAR
  Filled 2019-10-26: qty 1

## 2019-10-26 NOTE — ED Triage Notes (Signed)
Pt reports worsening shingles. Lesions are weeping and spreading from R side of face to L according to patient. R eye is swollen shut. Pt pain at a 7. He has not taken his 11p pain meds or his 12a antiviral. A&Ox4.

## 2019-10-26 NOTE — ED Provider Notes (Signed)
[facility] DEPT Provider Note   CSN: [id number] Arrival date & time: 10/26/19  2326     History Chief Complaint  Patient presents with  . Herpes Zoster    Stephen Gill is a 64 y.o. male.  The history is provided by the patient and medical records.   64 year old male with history of diabetes, presenting to the ED with shingles.  He was seen last night for same with full work-up, but comes in tonight due to increased pain. States he has been unable to get comfortable to rest.  He denies any changes in his vision.  He denies any pain of the orbit itself, pain is all localized to scalp and external eye.  He does not wear contact lenses.  He is due to see ophthalmology, Dr. Manuella Ghazi, in the a.m.  Has not had PM doses of valtrex or pain meds-- states norco is not helping his pain.  Past Medical History:  Diagnosis Date  . Calcaneus fracture, right   . Diabetes mellitus without complication ([place])   . History of kidney stones   . Kidney stone     There are no problems to display for this patient.   Past Surgical History:  Procedure Laterality Date  . EXTRACORPOREAL SHOCK WAVE LITHOTRIPSY Left 07/28/2019   Procedure: EXTRACORPOREAL SHOCK WAVE LITHOTRIPSY (ESWL);  Surgeon: Lucas Mallow, MD;  Location: [facility];  Service: Urology;  Laterality: Left;  . ORIF CALCANEOUS FRACTURE Right 04/25/2015   Procedure: OPEN REDUCTION INTERNAL FIXATION (ORIF) RIGHT CALCANEOUS FRACTURE;  Surgeon: Newt Minion, MD;  Location: [facility];  Service: Orthopedics;  Laterality: Right;  . URETEROSCOPY         Family History  Problem Relation Age of Onset  . Hypertension Mother   . Heart attack Brother     Social History   Tobacco Use  . Smoking status: Never Smoker  . Smokeless tobacco: Never Used  Substance Use Topics  . Alcohol use: No  . Drug use: No    Home Medications Prior to Admission medications   Medication Sig Start Date End Date Taking? Authorizing  Provider  HYDROcodone-acetaminophen (NORCO/VICODIN) 5-325 MG tablet Take 1 tablet by mouth every 6 (six) hours as needed for severe pain. 10/25/19   Ripley Fraise, MD  metFORMIN (GLUCOPHAGE) 500 MG tablet Take 500 mg by mouth daily. 06/04/19   [provider]  valACYclovir (VALTREX) 1000 MG tablet Take 1 tablet (1,000 mg total) by mouth 3 (three) times daily for 10 days. 10/25/19 11/04/19  Ripley Fraise, MD    Allergies    Patient has no known allergies.  Review of Systems   Review of Systems  Skin: Positive for rash.  All other systems reviewed and are negative.   Physical Exam Updated Vital Signs BP (!) 136/96 (BP Location: Left Arm)   Pulse 72   Temp 99.1 F (37.3 C) (Oral)   Resp 17   SpO2 96%   Physical Exam Vitals and nursing note reviewed.  Constitutional:      Appearance: He is well-developed.  HENT:     Head: Normocephalic and atraumatic.     Comments: Rash of right eyelid, forehead, and right scalp consistent with shingles, rash does not cross midline, majority of these lesions are scabbed over, there is no apparent drainage or weeping, no signs of superimposed infection or cellulitis Right eyelid is diffusely swollen Eyes:     Conjunctiva/sclera: Conjunctivae normal.     Pupils: Pupils are equal,  round, and reactive to light.  Cardiovascular:     Rate and Rhythm: Normal rate and regular rhythm.     Heart sounds: Normal heart sounds.  Pulmonary:     Effort: Pulmonary effort is normal.     Breath sounds: Normal breath sounds.  Abdominal:     General: Bowel sounds are normal.     Palpations: Abdomen is soft.  Musculoskeletal:        General: Normal range of motion.     Cervical back: Normal range of motion.  Skin:    General: Skin is warm and dry.  Neurological:     Mental Status: He is alert and oriented to person, place, and time.     ED Results / Procedures / Treatments   Labs (all labs ordered are listed, but only abnormal results are  displayed) Labs Reviewed - No data to display  EKG None  Radiology No results found.  Procedures Procedures (including critical care time)  Medications Ordered in ED Medications - No data to display  ED Course  I have reviewed the triage vital signs and the nursing notes.  Pertinent labs & imaging results that were available during my care of the patient were reviewed by me and considered in my medical decision making (see chart for details).    MDM Rules/Calculators/A&P  64 year old male here with shingles of the right face and scalp.  Seen last night for same.  Returns tonight due to increased pain.  He has not yet had his PM dose of Valtrex or pain medicine.  On exam, lesions remain localized to the right side of face without crossing midline.  There does not appear to be any significant change in comparison with photos from last evening.  He denies any visual disturbance or pain of the orbit itself, all pain is external along the face.  Will give p.m. dose of Valtrex and IM Dilaudid.  He is due to follow-up with ophthalmology, Dr. Manuella Ghazi, in the morning.  As he has not had great relief with hydrocodone, will switch to oxycodone for now.   He may return here for any new/acute changes.  Final Clinical Impression(s) / ED Diagnoses Final diagnoses:  Herpes zoster without complication    Rx / DC Orders ED Discharge Orders         Ordered    oxyCODONE-acetaminophen (PERCOCET/ROXICET) 5-325 MG tablet  Every 4 hours PRN     10/27/19 0043           Larene Pickett, PA-C 10/27/19 0117    Orpah Greek, MD 10/27/19 [phone]

## 2019-10-27 MED ORDER — OXYCODONE-ACETAMINOPHEN 5-325 MG PO TABS: 1.0000 | ORAL_TABLET | ORAL | 0 refills | Status: DC | PRN

## 2019-10-27 NOTE — Discharge Instructions (Signed)
Switch to the oxycodone for pain.  Stop taking the hydrocodone.  Continue the valtrex as prescribed. Follow-up with Dr. Manuella Ghazi in clinic in the morning-- I would call when they open to confirm appt time, they are expecting you. Return here for any new/acute changes.

## 2020-05-07 ENCOUNTER — Other Ambulatory Visit: Payer: Self-pay | Admitting: Urology

## 2020-12-30 ENCOUNTER — Other Ambulatory Visit: Payer: Self-pay

## 2020-12-30 ENCOUNTER — Emergency Department (HOSPITAL_COMMUNITY): Payer: Self-pay

## 2020-12-30 ENCOUNTER — Encounter (HOSPITAL_COMMUNITY): Payer: Self-pay

## 2020-12-30 ENCOUNTER — Emergency Department (HOSPITAL_COMMUNITY)
Admission: EM | Admit: 2020-12-30 | Discharge: 2020-12-30 | Disposition: A | Discharge status: Home / Self Care | Payer: Self-pay | Attending: Emergency Medicine | Admitting: Emergency Medicine

## 2020-12-30 DIAGNOSIS — R10A2 Flank pain, left side: Secondary | ICD-10-CM

## 2020-12-30 DIAGNOSIS — E1165 Type 2 diabetes mellitus with hyperglycemia: Secondary | ICD-10-CM | POA: Insufficient documentation

## 2020-12-30 DIAGNOSIS — R109 Unspecified abdominal pain: Secondary | ICD-10-CM

## 2020-12-30 DIAGNOSIS — Z7984 Long term (current) use of oral hypoglycemic drugs: Secondary | ICD-10-CM | POA: Insufficient documentation

## 2020-12-30 DIAGNOSIS — N200 Calculus of kidney: Secondary | ICD-10-CM

## 2020-12-30 DIAGNOSIS — N132 Hydronephrosis with renal and ureteral calculous obstruction: Secondary | ICD-10-CM | POA: Insufficient documentation

## 2020-12-30 LAB — CBC WITH DIFFERENTIAL/PLATELET
Abs Immature Granulocytes: 0.03 10*3/uL (ref 0.00–0.07)
Basophils Absolute: 0 10*3/uL (ref 0.0–0.1)
Basophils Relative: 0 %
Eosinophils Absolute: 0 10*3/uL (ref 0.0–0.5)
Eosinophils Relative: 0 %
HCT: 41.1 % (ref 39.0–52.0)
Hemoglobin: 13.5 g/dL (ref 13.0–17.0)
Immature Granulocytes: 0 %
Lymphocytes Relative: 15 %
Lymphs Abs: 1.7 10*3/uL (ref 0.7–4.0)
MCH: 30.1 pg (ref 26.0–34.0)
MCHC: 32.8 g/dL (ref 30.0–36.0)
MCV: 91.7 fL (ref 80.0–100.0)
Monocytes Absolute: 0.7 10*3/uL (ref 0.1–1.0)
Monocytes Relative: 6 %
Neutro Abs: 8.8 10*3/uL — ABNORMAL HIGH (ref 1.7–7.7)
Neutrophils Relative %: 79 %
Platelets: 161 10*3/uL (ref 150–400)
RBC: 4.48 MIL/uL (ref 4.22–5.81)
RDW: 12.5 % (ref 11.5–15.5)
WBC: 11.3 10*3/uL — ABNORMAL HIGH (ref 4.0–10.5)
nRBC: 0 % (ref 0.0–0.2)

## 2020-12-30 LAB — COMPREHENSIVE METABOLIC PANEL
ALT: 28 U/L (ref 0–44)
AST: 24 U/L (ref 15–41)
Albumin: 4.5 g/dL (ref 3.5–5.0)
Alkaline Phosphatase: 104 U/L (ref 38–126)
Anion gap: 9 (ref 5–15)
BUN: 20 mg/dL (ref 8–23)
CO2: 25 mmol/L (ref 22–32)
Calcium: 10.3 mg/dL (ref 8.9–10.3)
Chloride: 105 mmol/L (ref 98–111)
Creatinine, Ser: 1.27 mg/dL — ABNORMAL HIGH (ref 0.61–1.24)
GFR, Estimated: >60 mL/min (ref >60)
Glucose, Bld: 228 mg/dL — ABNORMAL HIGH (ref 70–99)
Potassium: 4.3 mmol/L (ref 3.5–5.1)
Sodium: 139 mmol/L (ref 135–145)
Total Bilirubin: 2 mg/dL — ABNORMAL HIGH (ref 0.3–1.2)
Total Protein: 7.5 g/dL (ref 6.5–8.1)

## 2020-12-30 LAB — URINALYSIS, ROUTINE W REFLEX MICROSCOPIC
Bacteria, UA: NONE SEEN
Bilirubin Urine: NEGATIVE
Glucose, UA: >=500 mg/dL — AB
Ketones, ur: NEGATIVE mg/dL
Leukocytes,Ua: NEGATIVE
Nitrite: NEGATIVE
Protein, ur: NEGATIVE mg/dL
RBC / HPF: >50 RBC/hpf — ABNORMAL HIGH (ref 0–5)
Specific Gravity, Urine: 1.023 (ref 1.005–1.030)
pH: 5 (ref 5.0–8.0)

## 2020-12-30 LAB — LIPASE, BLOOD: Lipase: 33 U/L (ref 11–51)

## 2020-12-30 MED ORDER — ONDANSETRON 4 MG PO TBDP: 4.0000 mg | ORAL_TABLET | Freq: Three times a day (TID) | ORAL | 0 refills | Status: DC | PRN

## 2020-12-30 MED ORDER — OXYCODONE-ACETAMINOPHEN 5-325 MG PO TABS: 1.0000 | ORAL_TABLET | Freq: Four times a day (QID) | ORAL | 0 refills | Status: DC | PRN

## 2020-12-30 NOTE — ED Notes (Signed)
Pt's rx sent to pharmacy, advised to also take motrin & tylenol, drink plenty of fluids, follow up w [facility]. Pt understands and ready for discharge

## 2020-12-30 NOTE — ED Provider Notes (Signed)
[facility] DEPT Provider Note   CSN: [id number] Arrival date & time: 12/30/20  1540     History Chief Complaint  Patient presents with  . Flank Pain    Stephen Gill is a 65 y.o. male.  HPI Patient is a 65 year old male presented today with left flank pain.  He has a history of kidney stones approximately 6 total he states he has had them annually for the past 6 years.  He states that he has followed with [facility] for this.  He has had lithotripsy the past.  He states that he feels less severe than prior ureterolithiasis.  He describes the pain as left flank/left back states that it is achy but intermittently sharp and severe.  States at worst it is 10/10 at best it is 2/10.  Denies any significant radiation although does seem to radiate slightly to his left flank/side from the left side of his back.  He denies any testicular pain swelling denies any penile discharge.  He endorses some burning with urination and states that he is peeing more frequently  He denies any nausea, vomiting, fevers or chills.  No chest pain or shortness of breath.  No other associate symptoms.    Past Medical History:  Diagnosis Date  . Calcaneus fracture, right   . Diabetes mellitus without complication ([place])   . History of kidney stones   . Kidney stone     There are no problems to display for this patient.   Past Surgical History:  Procedure Laterality Date  . EXTRACORPOREAL SHOCK WAVE LITHOTRIPSY Left 07/28/2019   Procedure: EXTRACORPOREAL SHOCK WAVE LITHOTRIPSY (ESWL);  Surgeon: Lucas Mallow, MD;  Location: [facility];  Service: Urology;  Laterality: Left;  . ORIF CALCANEOUS FRACTURE Right 04/25/2015   Procedure: OPEN REDUCTION INTERNAL FIXATION (ORIF) RIGHT CALCANEOUS FRACTURE;  Surgeon: Newt Minion, MD;  Location: [facility];  Service: Orthopedics;  Laterality: Right;  . URETEROSCOPY         Family History  Problem Relation Age of Onset  . Hypertension  Mother   . Heart attack Brother     Social History   Tobacco Use  . Smoking status: Never Smoker  . Smokeless tobacco: Never Used  Vaping Use  . Vaping Use: Never used  Substance Use Topics  . Alcohol use: No  . Drug use: No    Home Medications Prior to Admission medications   Medication Sig Start Date End Date Taking? Authorizing Provider  ondansetron (ZOFRAN ODT) 4 MG disintegrating tablet Take 1 tablet (4 mg total) by mouth every 8 (eight) hours as needed for nausea or vomiting. 12/30/20  Yes Skanda Worlds, Ova Freshwater S, PA  oxyCODONE-acetaminophen (PERCOCET/ROXICET) 5-325 MG tablet Take 1 tablet by mouth every 6 (six) hours as needed for severe pain. 12/30/20  Yes Kadynce Bonds S, PA  metFORMIN (GLUCOPHAGE) 500 MG tablet Take 500 mg by mouth daily. 06/04/19   [provider]  sulfamethoxazole-trimethoprim (BACTRIM DS) 800-160 MG tablet TAKE 1 TABLET BY MOUTH TWICE A DAY 05/12/20   McKenzie, Candee Furbish, MD    Allergies    Patient has no known allergies.  Review of Systems   Review of Systems  Constitutional: Negative for chills and fever.  HENT: Negative for congestion.   Eyes: Negative for pain.  Respiratory: Negative for cough and shortness of breath.   Cardiovascular: Negative for chest pain and leg swelling.  Gastrointestinal: Negative for abdominal pain, diarrhea, nausea and vomiting.  Genitourinary: Positive  for dysuria, flank pain, frequency and hematuria. Negative for penile discharge.  Musculoskeletal: Negative for myalgias.  Skin: Negative for rash.  Neurological: Negative for dizziness and headaches.    Physical Exam Updated Vital Signs BP (!) 139/98 (BP Location: Left Arm)   Pulse 72   Temp 97.9 F (36.6 C) (Oral)   Resp 18   Ht 6\' 4"  (1.93 m)   Wt 97.5 kg   SpO2 100%   BMI 26.17 kg/m   Physical Exam Vitals and nursing note reviewed.  Constitutional:      General: He is not in acute distress. HENT:     Head: Normocephalic and atraumatic.     Nose:  Nose normal.  Eyes:     General: No scleral icterus. Cardiovascular:     Rate and Rhythm: Normal rate and regular rhythm.     Pulses: Normal pulses.     Heart sounds: Normal heart sounds.  Pulmonary:     Effort: Pulmonary effort is normal. No respiratory distress.     Breath sounds: No wheezing.  Abdominal:     Palpations: Abdomen is soft.     Tenderness: There is no abdominal tenderness.     Comments: Abdomen is soft, nontender.  No guarding or rebound.  He has left-sided CVA tenderness.  Musculoskeletal:     Cervical back: Normal range of motion.     Right lower leg: No edema.     Left lower leg: No edema.  Skin:    General: Skin is warm and dry.     Capillary Refill: Capillary refill takes less than 2 seconds.  Neurological:     Mental Status: He is alert. Mental status is at baseline.  Psychiatric:        Mood and Affect: Mood normal.        Behavior: Behavior normal.     ED Results / Procedures / Treatments   Labs (all labs ordered are listed, but only abnormal results are displayed) Labs Reviewed  URINALYSIS, ROUTINE W REFLEX MICROSCOPIC - Abnormal; Notable for the following components:      Result Value   Glucose, UA >=500 (*)    Hgb urine dipstick LARGE (*)    RBC / HPF >50 (*)    All other components within normal limits  CBC WITH DIFFERENTIAL/PLATELET - Abnormal; Notable for the following components:   WBC 11.3 (*)    Neutro Abs 8.8 (*)    All other components within normal limits  COMPREHENSIVE METABOLIC PANEL - Abnormal; Notable for the following components:   Glucose, Bld 228 (*)    Creatinine, Ser 1.27 (*)    Total Bilirubin 2.0 (*)    All other components within normal limits  URINE CULTURE  LIPASE, BLOOD    EKG None  Radiology CT Renal Stone Study  Result Date: 12/30/2020 CLINICAL DATA:  Left side abdominal pain EXAM: CT ABDOMEN AND PELVIS WITHOUT CONTRAST TECHNIQUE: Multidetector CT imaging of the abdomen and pelvis was performed following the  standard protocol without IV contrast. COMPARISON:  07/25/2019 FINDINGS: Lower chest: No acute abnormality Hepatobiliary: No focal hepatic abnormality. Gallbladder unremarkable. Pancreas: No focal abnormality or ductal dilatation. Spleen: No focal abnormality.  Normal size. Adrenals/Urinary Tract: Moderate left hydronephrosis and perinephric stranding due to 3 mm distal left ureteral stone. Multiple bilateral nonobstructing renal stones. No hydronephrosis on the right. Adrenal glands and urinary bladder unremarkable. Stomach/Bowel: Stomach, large and small bowel grossly unremarkable. Normal appendix. Vascular/Lymphatic: No evidence of aneurysm or adenopathy. Reproductive: No visible  focal abnormality. Other: No free fluid or free air. Musculoskeletal: No acute bony abnormality. IMPRESSION: Bilateral nephrolithiasis. 3 mm distal left ureteral stone with moderate left hydronephrosis and perinephric stranding. Electronically Signed   By: Rolm Baptise M.D.   On: 12/30/2020 17:38    Procedures Procedures   Medications Ordered in ED Medications - No data to display  ED Course  I have reviewed the triage vital signs and the nursing notes.  Pertinent labs & imaging results that were available during my care of the patient were reviewed by me and considered in my medical decision making (see chart for details).    MDM Rules/Calculators/A&P                          Patient is 65 year old male presented today with left flank pain he has a history of nephrolithiasis.  He is followed by [facility].  Has required lithotripsy in the past.  He states his symptoms are much more mild than they have been in the past.  CT renal stone study shows 3 mm distal left ureteral stone there is some mild left hydronephrosis.  CMP unremarkable apart from mild hyperglycemia.  Creatinine actually improved from prior.  Lipase within normal limits of pancreatitis.  CBC with very mild increase in WBC.  Patient is afebrile,  urinalysis with hematuria and glucosuria but without bacteria, leukocytes or nitrates.  Urine culture pending.  There is some left-sided perinephric stranding on CT renal stone study.  We will send home without antibiotics.  Patient counseled to follow-up with urology who will see his urine culture result.  Final Clinical Impression(s) / ED Diagnoses Final diagnoses:  Left flank pain  Nephrolithiasis    Rx / DC Orders ED Discharge Orders         Ordered    oxyCODONE-acetaminophen (PERCOCET/ROXICET) 5-325 MG tablet  Every 6 hours PRN        12/30/20 1747    ondansetron (ZOFRAN ODT) 4 MG disintegrating tablet  Every 8 hours PRN        12/30/20 1748           Tedd Sias, [place] 12/30/20 1758    Little, Wenda Overland, MD 12/31/20 0003

## 2020-12-30 NOTE — ED Triage Notes (Signed)
Patient c/o left flank pain since 0600 today. Patient has a history of kidney stones.

## 2020-12-30 NOTE — Discharge Instructions (Addendum)
Please continue Flomax.  Use the Percocet I prescribed you.  Drink plenty of water.  Please use Tylenol or ibuprofen for pain.  You may use 600 mg ibuprofen every 6 hours or 1000 mg of Tylenol every 6 hours.  You may choose to alternate between the 2.  This would be most effective.  Not to exceed 4 g of Tylenol within 24 hours.  Not to exceed 3200 mg ibuprofen 24 hours.  Follow-up with [facility].

## 2020-12-30 NOTE — ED Triage Notes (Signed)
Emergency Medicine Provider Triage Evaluation Note  Stephen Gill , a 65 y.o. male  was evaluated in triage.  Pt complains of kidney stone pain that started this morning around 6 am, has taken advil for pain. Prior hx of kidney stones. Had prior lithotripsy and removal of his stones.   Review of Systems  Positive: Diarrhea, left flank pain, dysuria Negative: Fever, nausea, vomiting   Physical Exam  BP (!) 139/98 (BP Location: Left Arm)   Pulse 72   Temp 97.9 F (36.6 C) (Oral)   Resp 18   Ht 6\' 4"  (1.93 m)   Wt 97.5 kg   SpO2 100%   BMI 26.17 kg/m  Gen:   Awake, uncomfortable  HEENT:  Atraumatic  Resp:  Normal effort  Cardiac:  Normal rate  Abd:   Nondistended, nontender , L CVA tenderness MSK:   Moves extremities without difficulty  Neuro:  Speech clear   Medical Decision Making  Medically screening exam initiated at 3:58 PM.  Appropriate orders placed.  Stephen Gill was informed that the remainder of the evaluation will be completed by another provider, this initial triage assessment does not replace that evaluation, and the importance of remaining in the ED until their evaluation is complete.  Clinical Impression  Patient here with left flank pain x this morning, no OTC meds relieve. Followed by [facility]. Had prior intervention with lithotripsy and removal intraoperatively. Dysuria and hematuria. No fever. Plan: CT RENAL  LABS UA      Portions of this note were generated with Dragon dictation software. Dictation errors may occur despite best attempts at proofreading.    Janeece Fitting, PA-C 12/30/20 [phone]

## 2021-01-01 LAB — URINE CULTURE: Culture: <10000 — AB

## 2021-02-01 ENCOUNTER — Other Ambulatory Visit: Payer: Self-pay

## 2021-02-01 ENCOUNTER — Ambulatory Visit (INDEPENDENT_AMBULATORY_CARE_PROVIDER_SITE_OTHER): Payer: PRIVATE HEALTH INSURANCE | Admitting: Family

## 2021-02-01 ENCOUNTER — Encounter: Payer: Self-pay | Admitting: Family

## 2021-02-01 ENCOUNTER — Ambulatory Visit (HOSPITAL_BASED_OUTPATIENT_CLINIC_OR_DEPARTMENT_OTHER)
Admission: RE | Admit: 2021-02-01 | Discharge: 2021-02-01 | Disposition: A | Discharge status: Home / Self Care | Payer: PRIVATE HEALTH INSURANCE | Source: Ambulatory Visit | Attending: Family | Admitting: Family

## 2021-02-01 VITALS — BP 118/80 | HR 75 | Temp 98.0°F | Ht 76.0 in | Wt 218.2 lb

## 2021-02-01 DIAGNOSIS — Z1211 Encounter for screening for malignant neoplasm of colon: Secondary | ICD-10-CM | POA: Diagnosis not present

## 2021-02-01 DIAGNOSIS — G8929 Other chronic pain: Secondary | ICD-10-CM | POA: Diagnosis present

## 2021-02-01 DIAGNOSIS — E119 Type 2 diabetes mellitus without complications: Secondary | ICD-10-CM | POA: Diagnosis not present

## 2021-02-01 DIAGNOSIS — Z125 Encounter for screening for malignant neoplasm of prostate: Secondary | ICD-10-CM | POA: Diagnosis not present

## 2021-02-01 DIAGNOSIS — M25562 Pain in left knee: Secondary | ICD-10-CM | POA: Diagnosis not present

## 2021-02-01 MED ORDER — TAMSULOSIN HCL 0.4 MG PO CAPS: 0.4000 mg | ORAL_CAPSULE | Freq: Every day | ORAL | 1 refills | Status: DC

## 2021-02-01 NOTE — Progress Notes (Signed)
Stephen Gill is a 65 y.o. male with the following history as recorded in EpicCare:  There are no problems to display for this patient.   Current Outpatient Medications  Medication Sig Dispense Refill  . metFORMIN (GLUCOPHAGE) 500 MG tablet Take 500 mg by mouth daily.    . tamsulosin (FLOMAX) 0.4 MG CAPS capsule Take 1 capsule (0.4 mg total) by mouth at bedtime. 90 capsule 1   No current facility-administered medications for this visit.    Allergies: Patient has no known allergies.  Past Medical History:  Diagnosis Date  . Calcaneus fracture, right   . Diabetes mellitus without complication ([place])   . History of kidney stones   . Kidney stone     Past Surgical History:  Procedure Laterality Date  . EXTRACORPOREAL SHOCK WAVE LITHOTRIPSY Left 07/28/2019   Procedure: EXTRACORPOREAL SHOCK WAVE LITHOTRIPSY (ESWL);  Surgeon: Lucas Mallow, MD;  [facility];  Service: Urology;  Laterality: Left;  . ORIF CALCANEOUS FRACTURE Right 04/25/2015   Procedure: OPEN REDUCTION INTERNAL FIXATION (ORIF) RIGHT CALCANEOUS FRACTURE;  Surgeon: Newt Minion, MD;  Location: [facility];  Service: Orthopedics;  Laterality: Right;  . URETEROSCOPY      Family History  Problem Relation Age of Onset  . Hypertension Mother   . Heart attack Brother     Social History   Tobacco Use  . Smoking status: Never Smoker  . Smokeless tobacco: Never Used  Substance Use Topics  . Alcohol use: No    Subjective:   Presents today as a new patient; history of Type 2 Diabetes- stable on Metformin; last Hgba1c checked through his employer- approximately 2 months ago/ not sure of numbers;  History of recurrent kidney stones- sees urology regularly ( Dr. Gloriann Loan at [facility]) -every 6 months;  Left knee pain x 3 months- history of chronic left knee pain;     Objective:  Vitals:   02/01/21 1401  BP: 118/80  Pulse: 75  Temp: 98 F (36.7 C)  TempSrc: Oral  SpO2: 99%  Weight: 218 lb 3.2 oz (99 kg)  Height:  _0  (1.93 m)    General: Well developed, well nourished, in no acute distress  Skin : Warm and dry.  Head: Normocephalic and atraumatic  Lungs: Respirations unlabored; clear to auscultation bilaterally without wheeze, rales, rhonchi  CVS exam: normal rate and regular rhythm.  Musculoskeletal: No deformities; no active joint inflammation  Extremities: No edema, cyanosis, clubbing  Vessels: Symmetric bilaterally  Neurologic: Alert and oriented; speech intact; face symmetrical; moves all extremities well; CNII-XII intact without focal deficit    Assessment:  1. Colon cancer screening   2. Chronic pain of left knee   3. Type 2 diabetes mellitus without complication, without long-term current use of insulin ([place])   4. Prostate cancer screening     Plan:  1. Order for colonoscopy updated; 2. Update left knee x-ray; will most likely need to refer to orthopedist; 3. Update labs today including CBC, CMP, lipid panel, Hgba1c; continue Metformin- will adjust as needed; 4. Check PSA today; refill on Flomax; keep planned follow up with urology;   Follow- up to be determined based on labs;  This visit occurred during the SARS-CoV-2 public health emergency.  Safety protocols were in place, including screening questions prior to the visit, additional usage of staff PPE, and extensive cleaning of exam room while observing appropriate contact time as indicated for disinfecting solutions.      No follow-ups on file.  Orders Placed This Encounter  Procedures  . DG Knee Complete 4 Views Left    Standing Status:   Future    Number of Occurrences:   1    Standing Expiration Date:   02/01/2022    Order Specific Question:   Reason for Exam (SYMPTOM  OR DIAGNOSIS REQUIRED)    Answer:   left knee pain    Order Specific Question:   Preferred imaging location?    Answer:   [facility]  . CBC with Differential/Platelet  . Comp Met (CMET)  . Lipid panel  . PSA  . Hemoglobin A1c  .  Ambulatory referral to Gastroenterology    Referral Priority:   Routine    Referral Type:   Consultation    Referral Reason:   Specialty Services Required    Number of Visits Requested:   1    Requested Prescriptions   Signed Prescriptions Disp Refills  . tamsulosin (FLOMAX) 0.4 MG CAPS capsule 90 capsule 1    Sig: Take 1 capsule (0.4 mg total) by mouth at bedtime.

## 2021-02-02 ENCOUNTER — Other Ambulatory Visit: Payer: Self-pay | Admitting: Family

## 2021-02-02 LAB — LIPID PANEL
Cholesterol: 220 mg/dL — ABNORMAL HIGH (ref 0–200)
HDL: 40.9 mg/dL (ref >39.00)
LDL Cholesterol: 163 mg/dL — ABNORMAL HIGH (ref 0–99)
NonHDL: 179.06
Total CHOL/HDL Ratio: 5
Triglycerides: 79 mg/dL (ref 0.0–149.0)
VLDL: 15.8 mg/dL (ref 0.0–40.0)

## 2021-02-02 LAB — CBC WITH DIFFERENTIAL/PLATELET
Basophils Absolute: 0 10*3/uL (ref 0.0–0.1)
Basophils Relative: 0.8 % (ref 0.0–3.0)
Eosinophils Absolute: 0.1 10*3/uL (ref 0.0–0.7)
Eosinophils Relative: 2.5 % (ref 0.0–5.0)
HCT: 40.3 % (ref 39.0–52.0)
Hemoglobin: 13.4 g/dL (ref 13.0–17.0)
Lymphocytes Relative: 37.7 % (ref 12.0–46.0)
Lymphs Abs: 1.7 10*3/uL (ref 0.7–4.0)
MCHC: 33.2 g/dL (ref 30.0–36.0)
MCV: 91.1 fl (ref 78.0–100.0)
Monocytes Absolute: 0.4 10*3/uL (ref 0.1–1.0)
Monocytes Relative: 8.4 % (ref 3.0–12.0)
Neutro Abs: 2.3 10*3/uL (ref 1.4–7.7)
Neutrophils Relative %: 50.6 % (ref 43.0–77.0)
Platelets: 146 10*3/uL — ABNORMAL LOW (ref 150.0–400.0)
RBC: 4.43 Mil/uL (ref 4.22–5.81)
RDW: 12.8 % (ref 11.5–15.5)
WBC: 4.5 10*3/uL (ref 4.0–10.5)

## 2021-02-02 LAB — COMPREHENSIVE METABOLIC PANEL
ALT: 16 U/L (ref 0–53)
AST: 15 U/L (ref 0–37)
Albumin: 4.6 g/dL (ref 3.5–5.2)
Alkaline Phosphatase: 112 U/L (ref 39–117)
BUN: 23 mg/dL (ref 6–23)
CO2: 26 mEq/L (ref 19–32)
Calcium: 9.9 mg/dL (ref 8.4–10.5)
Chloride: 104 mEq/L (ref 96–112)
Creatinine, Ser: 1.18 mg/dL (ref 0.40–1.50)
GFR: 66.09 mL/min (ref >60.00)
Glucose, Bld: 173 mg/dL — ABNORMAL HIGH (ref 70–99)
Potassium: 4.2 mEq/L (ref 3.5–5.1)
Sodium: 139 mEq/L (ref 135–145)
Total Bilirubin: 1.5 mg/dL — ABNORMAL HIGH (ref 0.2–1.2)
Total Protein: 7 g/dL (ref 6.0–8.3)

## 2021-02-02 LAB — HEMOGLOBIN A1C: Hgb A1c MFr Bld: 9.8 % — ABNORMAL HIGH (ref 4.6–6.5)

## 2021-02-02 LAB — PSA: PSA: 1.12 ng/mL (ref 0.10–4.00)

## 2021-02-02 MED ORDER — METFORMIN HCL 500 MG PO TABS: 1000.0000 mg | ORAL_TABLET | Freq: Two times a day (BID) | ORAL | 0 refills | Status: DC

## 2021-02-02 MED ORDER — ROSUVASTATIN CALCIUM 10 MG PO TABS: 10.0000 mg | ORAL_TABLET | Freq: Every day | ORAL | 0 refills | Status: DC

## 2021-02-03 ENCOUNTER — Other Ambulatory Visit: Payer: Self-pay | Admitting: Family

## 2021-02-03 DIAGNOSIS — G8929 Other chronic pain: Secondary | ICD-10-CM

## 2021-02-09 ENCOUNTER — Ambulatory Visit (INDEPENDENT_AMBULATORY_CARE_PROVIDER_SITE_OTHER): Payer: PRIVATE HEALTH INSURANCE | Admitting: Family Medicine

## 2021-02-09 ENCOUNTER — Other Ambulatory Visit: Payer: Self-pay

## 2021-02-09 ENCOUNTER — Ambulatory Visit: Payer: Self-pay

## 2021-02-09 VITALS — BP 120/80 | Ht 76.0 in | Wt 218.0 lb

## 2021-02-09 DIAGNOSIS — M222X2 Patellofemoral disorders, left knee: Secondary | ICD-10-CM | POA: Insufficient documentation

## 2021-02-09 DIAGNOSIS — M25562 Pain in left knee: Secondary | ICD-10-CM

## 2021-02-09 NOTE — Assessment & Plan Note (Signed)
No effusion appreciated and has weakness with hip abduction on exam.  Seems more patellofemoral in nature as he does walk significantly through the course of the day. -Counseled on home exercise therapy and supportive care. -Provided Duexis samples. -Reaction brace. -Could consider physical therapy or injection.

## 2021-02-09 NOTE — Progress Notes (Signed)
Medication Samples have been provided to the patient.  Drug name: Duexis       Strength: 800/26.6 mg        Qty: 3 boxes [id number]   Exp.Date: 01/2021:   Dosing instructions: take 1 three times daily x 5 days  The patient has been instructed regarding the correct time, dose, and frequency of taking this medication, including desired effects and most common side effects.   Jonathon Resides 5:03 PM 02/09/2021

## 2021-02-09 NOTE — Patient Instructions (Signed)
Nice to meet you Please try the duexis for 5 days straight and then as needed  Please try brace Please try ice  Please try the exercises   Please send me a message in MyChart with any questions or updates.  Please see me back in 4 weeks.   --Dr. Raeford Razor

## 2021-02-09 NOTE — Progress Notes (Signed)
Stephen Gill - 65 y.o. male MRN [id number]  Date of birth: [birth year]  SUBJECTIVE:  Including CC & ROS.  No chief complaint on file.   Stephen Gill is a 64 y.o. male that is presenting with acute left knee pain.  Has been ongoing for about 2 months.  No injury or inciting event.  No history of similar pain.  Seems to be getting worse.  It is worse with walking..  Independent review of the left knee x-ray from 5/10 shows no acute changes.  Mild medial joint space narrowing.   Review of Systems See HPI   HISTORY: Past Medical, Surgical, Social, and Family History Reviewed & Updated per EMR.   Pertinent Historical Findings include:  Past Medical History:  Diagnosis Date  . Calcaneus fracture, right   . Diabetes mellitus without complication ([place])   . History of kidney stones   . Kidney stone     Past Surgical History:  Procedure Laterality Date  . EXTRACORPOREAL SHOCK WAVE LITHOTRIPSY Left 07/28/2019   Procedure: EXTRACORPOREAL SHOCK WAVE LITHOTRIPSY (ESWL);  Surgeon: Lucas Mallow, MD;  [facility];  Service: Urology;  Laterality: Left;  . ORIF CALCANEOUS FRACTURE Right 04/25/2015   Procedure: OPEN REDUCTION INTERNAL FIXATION (ORIF) RIGHT CALCANEOUS FRACTURE;  Surgeon: Newt Minion, MD;  Location: [facility];  Service: Orthopedics;  Laterality: Right;  . URETEROSCOPY      Family History  Problem Relation Age of Onset  . Hypertension Mother   . Heart attack Brother     Social History   Socioeconomic History  . Marital status: Married    Spouse name: Not on file  . Number of children: Not on file  . Years of education: Not on file  . Highest education level: Not on file  Occupational History  . Not on file  Tobacco Use  . Smoking status: Never Smoker  . Smokeless tobacco: Never Used  Vaping Use  . Vaping Use: Never used  Substance and Sexual Activity  . Alcohol use: No  . Drug use: No  . Sexual activity: Not on file  Other Topics Concern  . Not on file   Social History Narrative  . Not on file   Social Determinants of Health   Financial Resource Strain: Not on file  Food Insecurity: Not on file  Transportation Needs: Not on file  Physical Activity: Not on file  Stress: Not on file  Social Connections: Not on file  Intimate Partner Violence: Not on file     PHYSICAL EXAM:  VS: BP 120/80   Ht 6\' 4"  (1.93 m)   Wt 218 lb (98.9 kg)   BMI 26.54 kg/m  Physical Exam Gen: NAD, alert, cooperative with exam, well-appearing MSK:  Left knee: No effusion. Normal range of motion. Normal strength resistance with knee flexion extension. Weakness with hip abduction. Neurovascular intact  Limited ultrasound: Left knee:  No effusion with suprapatellar pouch. Normal-appearing quadricep and patellar tendon. Mild medial joint space narrowing and outpouching of the medial meniscus. Preserved lateral joint space. Increased hyperemia at the inferior lateral pole of the patella.  Summary: Findings suggest patellofemoral irritation.  Ultrasound and interpretation by Clearance Coots, MD    ASSESSMENT & PLAN:   Patellofemoral pain syndrome of left knee No effusion appreciated and has weakness with hip abduction on exam.  Seems more patellofemoral in nature as he does walk significantly through the course of the day. -Counseled on home exercise therapy and supportive care. -Provided Duexis  samples. -Reaction brace. -Could consider physical therapy or injection.

## 2021-02-16 ENCOUNTER — Encounter: Payer: Self-pay | Admitting: Gastroenterology

## 2021-03-09 ENCOUNTER — Encounter: Payer: Self-pay | Admitting: Family Medicine

## 2021-03-09 ENCOUNTER — Ambulatory Visit (INDEPENDENT_AMBULATORY_CARE_PROVIDER_SITE_OTHER): Payer: PRIVATE HEALTH INSURANCE | Admitting: Family Medicine

## 2021-03-09 ENCOUNTER — Ambulatory Visit: Payer: Self-pay

## 2021-03-09 ENCOUNTER — Other Ambulatory Visit: Payer: Self-pay

## 2021-03-09 VITALS — BP 118/82 | Ht 76.0 in | Wt 218.0 lb

## 2021-03-09 DIAGNOSIS — M1712 Unilateral primary osteoarthritis, left knee: Secondary | ICD-10-CM

## 2021-03-09 MED ORDER — TRIAMCINOLONE ACETONIDE 40 MG/ML IJ SUSP
40.0000 mg | Freq: Once | INTRAMUSCULAR | Status: AC
  Administered 2021-03-09: 40 mg via INTRA_ARTICULAR

## 2021-03-09 NOTE — Progress Notes (Signed)
Stephen Gill - 65 y.o. male MRN [id number]  Date of birth: [birth year]  SUBJECTIVE:  Including CC & ROS.  No chief complaint on file.   Stephen Gill is a 65 y.o. male that is presenting with worsening of his left knee pain.  Has tried bracing and medication with limited improvement.  Pain is still ongoing.   Review of Systems See HPI   HISTORY: Past Medical, Surgical, Social, and Family History Reviewed & Updated per EMR.   Pertinent Historical Findings include:  Past Medical History:  Diagnosis Date   Calcaneus fracture, right    Diabetes mellitus without complication ([place])    History of kidney stones    Kidney stone     Past Surgical History:  Procedure Laterality Date   EXTRACORPOREAL SHOCK WAVE LITHOTRIPSY Left 07/28/2019   Procedure: EXTRACORPOREAL SHOCK WAVE LITHOTRIPSY (ESWL);  Surgeon: Lucas Mallow, MD;  [facility];  Service: Urology;  Laterality: Left;   ORIF CALCANEOUS FRACTURE Right 04/25/2015   Procedure: OPEN REDUCTION INTERNAL FIXATION (ORIF) RIGHT CALCANEOUS FRACTURE;  Surgeon: Newt Minion, MD;  Location: [facility];  Service: Orthopedics;  Laterality: Right;   URETEROSCOPY      Family History  Problem Relation Age of Onset   Hypertension Mother    Heart attack Brother     Social History   Socioeconomic History   Marital status: Married    Spouse name: Not on file   Number of children: Not on file   Years of education: Not on file   Highest education level: Not on file  Occupational History   Not on file  Tobacco Use   Smoking status: Never   Smokeless tobacco: Never  Vaping Use   Vaping Use: Never used  Substance and Sexual Activity   Alcohol use: No   Drug use: No   Sexual activity: Not on file  Other Topics Concern   Not on file  Social History Narrative   Not on file   Social Determinants of Health   Financial Resource Strain: Not on file  Food Insecurity: Not on file  Transportation Needs: Not on file  Physical Activity: Not  on file  Stress: Not on file  Social Connections: Not on file  Intimate Partner Violence: Not on file     PHYSICAL EXAM:  VS: BP 118/82 (BP Location: Left Arm, Patient Position: Sitting, Cuff Size: Large)   Ht 6\' 4"  (1.93 m)   Wt 218 lb (98.9 kg)   BMI 26.54 kg/m  Physical Exam Gen: NAD, alert, cooperative with exam, well-appearing MSK:  Left knee: No obvious effusion. Normal range of motion. Some tenderness along the joint space. Neurovascular intact   Aspiration/Injection Procedure Note Stephen Gill 1956/01/25  Procedure: Injection Indications: Left knee pain  Procedure Details Consent: Risks of procedure as well as the alternatives and risks of each were explained to the (patient/caregiver).  Consent for procedure obtained. Time Out: Verified patient identification, verified procedure, site/side was marked, verified correct patient position, special equipment/implants available, medications/allergies/relevent history reviewed, required imaging and test results available.  Performed.  The area was cleaned with iodine and alcohol swabs.    The left knee superior lateral suprapatellar pouch was injected using 3 cc of 1% lidocaine on a 25-gauge 1-1/2 inch needle.  The syringe was switched to mixture containing 1 cc's of 40 mg Kenalog and 4 cc's of 0.25% bupivacaine was injected.  Ultrasound was used. Images were obtained in long views showing the injection.  A sterile dressing was applied.  Patient did tolerate procedure well.    ASSESSMENT & PLAN:   Primary osteoarthritis of left knee Seems to have a component of degenerative changes to the source of his ongoing pain. -Counseled on home exercise therapy and supportive care. -Injection today. -Could consider physical therapy.

## 2021-03-09 NOTE — Patient Instructions (Signed)
Good to see you Please try ice  Please try voltaren if needed   Please send me a message in MyChart with any questions or updates.  Please see me back in 4 weeks.   --Dr. Raeford Razor

## 2021-03-09 NOTE — Assessment & Plan Note (Signed)
Seems to have a component of degenerative changes to the source of his ongoing pain. -Counseled on home exercise therapy and supportive care. -Injection today. -Could consider physical therapy.

## 2021-04-05 ENCOUNTER — Ambulatory Visit: Payer: PRIVATE HEALTH INSURANCE | Admitting: Family

## 2021-04-06 ENCOUNTER — Ambulatory Visit (INDEPENDENT_AMBULATORY_CARE_PROVIDER_SITE_OTHER): Payer: No Typology Code available for payment source | Admitting: Family Medicine

## 2021-04-06 ENCOUNTER — Encounter: Payer: Self-pay | Admitting: Family Medicine

## 2021-04-06 ENCOUNTER — Other Ambulatory Visit: Payer: Self-pay

## 2021-04-06 DIAGNOSIS — M1712 Unilateral primary osteoarthritis, left knee: Secondary | ICD-10-CM | POA: Diagnosis not present

## 2021-04-06 NOTE — Progress Notes (Signed)
  Stephen Gill - 65 y.o. male MRN [id number]  Date of birth: 1956-08-18  SUBJECTIVE:  Including CC & ROS.  No chief complaint on file.   Stephen Gill is a 65 y.o. male that is following up for his left knee pain.  Has been doing well since the injection..   Review of Systems See HPI   HISTORY: Past Medical, Surgical, Social, and Family History Reviewed & Updated per EMR.   Pertinent Historical Findings include:  Past Medical History:  Diagnosis Date   Calcaneus fracture, right    Diabetes mellitus without complication ([place])    History of kidney stones    Kidney stone     Past Surgical History:  Procedure Laterality Date   EXTRACORPOREAL SHOCK WAVE LITHOTRIPSY Left 07/28/2019   Procedure: EXTRACORPOREAL SHOCK WAVE LITHOTRIPSY (ESWL);  Surgeon: Lucas Mallow, MD;  Location: [facility];  Service: Urology;  Laterality: Left;   ORIF CALCANEOUS FRACTURE Right 04/25/2015   Procedure: OPEN REDUCTION INTERNAL FIXATION (ORIF) RIGHT CALCANEOUS FRACTURE;  Surgeon: Newt Minion, MD;  Location: [facility];  Service: Orthopedics;  Laterality: Right;   URETEROSCOPY      Family History  Problem Relation Age of Onset   Hypertension Mother    Heart attack Brother     Social History   Socioeconomic History   Marital status: Married    Spouse name: Not on file   Number of children: Not on file   Years of education: Not on file   Highest education level: Not on file  Occupational History   Not on file  Tobacco Use   Smoking status: Never   Smokeless tobacco: Never  Vaping Use   Vaping Use: Never used  Substance and Sexual Activity   Alcohol use: No   Drug use: No   Sexual activity: Not on file  Other Topics Concern   Not on file  Social History Narrative   Not on file   Social Determinants of Health   Financial Resource Strain: Not on file  Food Insecurity: Not on file  Transportation Needs: Not on file  Physical Activity: Not on file  Stress: Not on file  Social Connections:  Not on file  Intimate Partner Violence: Not on file     PHYSICAL EXAM:  VS: BP 120/70 (BP Location: Left Arm, Patient Position: Sitting, Cuff Size: Large)   Ht 5\' 4"  (1.626 m)   Wt 218 lb (98.9 kg)   BMI 37.42 kg/m  Physical Exam Gen: NAD, alert, cooperative with exam, well-appearing       ASSESSMENT & PLAN:   Primary osteoarthritis of left knee Significant improvement with steroid injection  -Counseled on home exercise therapy and supportive care. -Could consider gel injection, PRP or physical therapy.

## 2021-04-06 NOTE — Assessment & Plan Note (Signed)
Significant improvement with steroid injection  -Counseled on home exercise therapy and supportive care. -Could consider gel injection, PRP or physical therapy.

## 2021-04-07 ENCOUNTER — Ambulatory Visit: Payer: No Typology Code available for payment source

## 2021-04-07 ENCOUNTER — Telehealth: Payer: Self-pay

## 2021-04-07 NOTE — Progress Notes (Deleted)
Patient's pre-visit was done today over the phone with the patient    Name,DOB and address verified.    Patient denies any allergies to Eggs and Soy. Patient denies any problems with anesthesia/sedation. Patient denies taking diet pills or blood thinners. No home Oxygen. Packet of Prep instructions mailed to patient including a copy of a consent form-pt is aware. Patient understands to call us back with any questions or concerns. Patient is aware of our care-partner policy and HRCBU-38 safety protocol.

## 2021-04-07 NOTE — Telephone Encounter (Signed)
Attempted x 3 to reach pt for virtual PV.  Each call rolled to voice mail.    Left message for pt to r/t call by 5:00 today to r/s previsit in order to prevent cancellation of scheduled procedure.

## 2021-04-07 NOTE — Telephone Encounter (Signed)
Pt called to reschedule PV for tomorrow 7/15.

## 2021-04-08 ENCOUNTER — Other Ambulatory Visit: Payer: Self-pay

## 2021-04-08 ENCOUNTER — Ambulatory Visit (AMBULATORY_SURGERY_CENTER): Payer: No Typology Code available for payment source | Admitting: *Deleted

## 2021-04-08 VITALS — Ht 76.0 in | Wt 225.0 lb

## 2021-04-08 DIAGNOSIS — Z1211 Encounter for screening for malignant neoplasm of colon: Secondary | ICD-10-CM

## 2021-04-08 MED ORDER — NA SULFATE-K SULFATE-MG SULF 17.5-3.13-1.6 GM/177ML PO SOLN: 1.0000 | Freq: Once | ORAL | 0 refills | Status: AC

## 2021-04-08 NOTE — Progress Notes (Signed)
Virtual pre visit completed. Instructions mailed to patient.  No egg or soy allergy known to patient  No issues with past sedation with any surgeries or procedures Patient denies ever being told they had issues or difficulty with intubation  No FH of Malignant Hyperthermia No diet pills per patient No home 02 use per patient  No blood thinners per patient  Pt denies issues with constipation  No A fib or A flutter  EMMI video to pt or via MyChart  COVID 19 guidelines implemented in [place] today with Pt and RN   Discussed with pt there will be an out-of-pocket cost for prep and that varies from $0 to 70 dollars   Due to the COVID-19 pandemic we are asking patients to follow certain guidelines.  Pt aware of COVID protocols and [facility] guidelines

## 2021-04-12 ENCOUNTER — Encounter: Payer: Self-pay | Admitting: Family

## 2021-04-12 ENCOUNTER — Other Ambulatory Visit: Payer: Self-pay

## 2021-04-12 ENCOUNTER — Ambulatory Visit (INDEPENDENT_AMBULATORY_CARE_PROVIDER_SITE_OTHER): Payer: No Typology Code available for payment source | Admitting: Family

## 2021-04-12 VITALS — BP 128/80 | HR 64 | Temp 97.4°F | Ht 76.0 in | Wt 225.2 lb

## 2021-04-12 DIAGNOSIS — M791 Myalgia, unspecified site: Secondary | ICD-10-CM | POA: Diagnosis not present

## 2021-04-12 DIAGNOSIS — B372 Candidiasis of skin and nail: Secondary | ICD-10-CM | POA: Diagnosis not present

## 2021-04-12 DIAGNOSIS — E119 Type 2 diabetes mellitus without complications: Secondary | ICD-10-CM

## 2021-04-12 DIAGNOSIS — E782 Mixed hyperlipidemia: Secondary | ICD-10-CM

## 2021-04-12 LAB — POCT GLYCOSYLATED HEMOGLOBIN (HGB A1C): Hemoglobin A1C: 10.1 % — AB (ref 4.0–5.6)

## 2021-04-12 MED ORDER — FLUCONAZOLE 100 MG PO TABS: 100.0000 mg | ORAL_TABLET | Freq: Every day | ORAL | 0 refills | Status: DC

## 2021-04-12 NOTE — Progress Notes (Signed)
Stephen Gill is a 65 y.o. male with the following history as recorded in EpicCare:  Patient Active Problem List   Diagnosis Date Noted   Primary osteoarthritis of left knee 03/09/2021   Patellofemoral pain syndrome of left knee 02/09/2021    Current Outpatient Medications  Medication Sig Dispense Refill   fluconazole (DIFLUCAN) 100 MG tablet Take 1 tablet (100 mg total) by mouth daily. 7 tablet 0   metFORMIN (GLUCOPHAGE) 500 MG tablet Take 2 tablets (1,000 mg total) by mouth 2 (two) times daily with a meal. 360 tablet 0   rosuvastatin (CRESTOR) 10 MG tablet Take 1 tablet (10 mg total) by mouth daily. 90 tablet 0   tamsulosin (FLOMAX) 0.4 MG CAPS capsule Take 1 capsule (0.4 mg total) by mouth at bedtime. 90 capsule 1   No current facility-administered medications for this visit.    Allergies: Patient has no known allergies.  Past Medical History:  Diagnosis Date   Calcaneus fracture, right    Diabetes mellitus without complication ([place])    History of kidney stones    Kidney stone    Shingles     Past Surgical History:  Procedure Laterality Date   EXTRACORPOREAL SHOCK WAVE LITHOTRIPSY Left 07/28/2019   Procedure: EXTRACORPOREAL SHOCK WAVE LITHOTRIPSY (ESWL);  Surgeon: Lucas Mallow, MD;  Location: [facility];  Service: Urology;  Laterality: Left;   LITHOTRIPSY     ORIF CALCANEOUS FRACTURE Right 04/25/2015   Procedure: OPEN REDUCTION INTERNAL FIXATION (ORIF) RIGHT CALCANEOUS FRACTURE;  Surgeon: Newt Minion, MD;  Location: [facility];  Service: Orthopedics;  Laterality: Right;   URETEROSCOPY      Family History  Problem Relation Age of Onset   Hypertension Mother    Heart attack Brother    Stomach cancer Other    Rectal cancer Other    Esophageal cancer Other    Colon polyps Other    Colon cancer Other     Social History   Tobacco Use   Smoking status: Never   Smokeless tobacco: Never  Substance Use Topics   Alcohol use: No    Subjective:   2 month follow up on Type 2  Diabetes; as last OV, hgba1c was at 9.8; on re-check today, elevated at 10.1; was asked to take Metformin 1000 mg twice a day; taking medication as prescribed and trying to work on cutting back on junk food;  Did get a steroid shot in his knee last month- notes that knee is feeling better; Also concerned about redness/ discomfort/ whitish discharge around head of penis; denies any STD exposure;  Mentions that he has had increased cramping recently;      Objective:  Vitals:   04/12/21 1417  BP: 128/80  Pulse: 64  Temp: (!) 97.4 F (36.3 C)  TempSrc: Oral  SpO2: 97%  Weight: 225 lb 3.2 oz (102.2 kg)  Height: _0  (1.93 m)    General: Well developed, well nourished, in no acute distress  Skin : Warm and dry.  Head: Normocephalic and atraumatic  Eyes: Sclera and conjunctiva clear; pupils round and reactive to light; extraocular movements intact  Ears: External normal; canals clear; tympanic membranes normal  Oropharynx: Pink, supple. No suspicious lesions  Neck: Supple without thyromegaly, adenopathy  Lungs: Respirations unlabored;  Vessels: Symmetric bilaterally  Neurologic: Alert and oriented; speech intact; face symmetrical; moves all extremities well; CNII-XII intact without focal deficit  Erythema c/w yeast infection noted at head of penis;  Assessment:  1. Type 2  diabetes mellitus without complication, without long-term current use of insulin ([place])   2. Mixed hyperlipidemia   3. Myalgia   4. Skin yeast infection     Plan:  Worsening of Hgba1c but patient had steroid injection last month- ? If this caused symptoms; check CMP today; continue Metformin as prescribed- follow up to be determined; & 3. Hold Crestor for now- concern for reaction;  4.   Rx for Diflcuan 100 mg qd x 7 days; reassurance would be related to elevated glucose;  This visit occurred during the SARS-CoV-2 public health emergency.  Safety protocols were in place, including screening questions prior to the  visit, additional usage of staff PPE, and extensive cleaning of exam room while observing appropriate contact time as indicated for disinfecting solutions.    No follow-ups on file.  Orders Placed This Encounter  Procedures   Comp Met (CMET)   Lipid panel   CK (Creatine Kinase)   POCT HgB A1C    Requested Prescriptions   Signed Prescriptions Disp Refills   fluconazole (DIFLUCAN) 100 MG tablet 7 tablet 0    Sig: Take 1 tablet (100 mg total) by mouth daily.

## 2021-04-13 ENCOUNTER — Other Ambulatory Visit: Payer: Self-pay | Admitting: Family

## 2021-04-13 LAB — COMPREHENSIVE METABOLIC PANEL
ALT: 21 U/L (ref 0–53)
AST: 18 U/L (ref 0–37)
Albumin: 4.5 g/dL (ref 3.5–5.2)
Alkaline Phosphatase: 108 U/L (ref 39–117)
BUN: 18 mg/dL (ref 6–23)
CO2: 31 mEq/L (ref 19–32)
Calcium: 9.7 mg/dL (ref 8.4–10.5)
Chloride: 103 mEq/L (ref 96–112)
Creatinine, Ser: 1.08 mg/dL (ref 0.40–1.50)
GFR: 73.4 mL/min (ref >60.00)
Glucose, Bld: 187 mg/dL — ABNORMAL HIGH (ref 70–99)
Potassium: 3.9 mEq/L (ref 3.5–5.1)
Sodium: 139 mEq/L (ref 135–145)
Total Bilirubin: 1.3 mg/dL — ABNORMAL HIGH (ref 0.2–1.2)
Total Protein: 6.5 g/dL (ref 6.0–8.3)

## 2021-04-13 LAB — LIPID PANEL
Cholesterol: 105 mg/dL (ref 0–200)
HDL: 37.4 mg/dL — ABNORMAL LOW (ref >39.00)
LDL Cholesterol: 59 mg/dL (ref 0–99)
NonHDL: 67.79
Total CHOL/HDL Ratio: 3
Triglycerides: 42 mg/dL (ref 0.0–149.0)
VLDL: 8.4 mg/dL (ref 0.0–40.0)

## 2021-04-13 LAB — CK: Total CK: 137 U/L (ref 7–232)

## 2021-04-13 MED ORDER — RYBELSUS 3 MG PO TABS: 3.0000 mg | ORAL_TABLET | Freq: Every day | ORAL | 1 refills | Status: DC

## 2021-04-14 ENCOUNTER — Telehealth: Payer: Self-pay

## 2021-04-14 NOTE — Telephone Encounter (Signed)
-----   Message from Marrian Salvage, [place] sent at 04/13/2021  4:13 PM EDT ----- His blood sugar in the office yesterday was 187; this tells me that the steroid shot is not the reason for the slight bump in the Hgba1c. We need to add another medication to his Metformin regimen. I am going to try him on a medication called Rybelsus 3 mg and he needs to take this once a day along with the Metformin 2 tablets twice a day. If the Rybelsus is too expensive, please let me know. Let's follow up again in about 6 weeks.   Has felt any better off the Crestor?

## 2021-04-14 NOTE — Telephone Encounter (Signed)
I have called the pt and relayed the message from the provider.   Pt stated that he is feeling better off of the Crestor.  Also he also said that the medication Rybelsus 3 mg is going to be $400+.   I have informed him that we will find another alternative.

## 2021-04-15 ENCOUNTER — Other Ambulatory Visit: Payer: Self-pay | Admitting: Family

## 2021-04-15 MED ORDER — GLIMEPIRIDE 4 MG PO TABS: 4.0000 mg | ORAL_TABLET | Freq: Every day | ORAL | 3 refills | Status: DC

## 2021-04-15 NOTE — Addendum Note (Signed)
Addended by: Kittie Plater, Marney Treloar HUA on: 04/15/2021 11:40 AM   Modules accepted: Orders

## 2021-04-15 NOTE — Telephone Encounter (Signed)
Called pt and rx resent to [facility]. Pt stated understanding of the new plan.

## 2021-04-18 ENCOUNTER — Encounter: Payer: Self-pay | Admitting: Certified Registered Nurse Anesthetist

## 2021-04-19 ENCOUNTER — Encounter: Payer: Self-pay | Admitting: Gastroenterology

## 2021-04-19 ENCOUNTER — Ambulatory Visit (AMBULATORY_SURGERY_CENTER): Payer: No Typology Code available for payment source | Admitting: Gastroenterology

## 2021-04-19 ENCOUNTER — Other Ambulatory Visit: Payer: Self-pay

## 2021-04-19 VITALS — BP 115/85 | HR 65 | Temp 97.8°F | Resp 15 | Ht 76.0 in | Wt 225.0 lb

## 2021-04-19 DIAGNOSIS — D128 Benign neoplasm of rectum: Secondary | ICD-10-CM | POA: Diagnosis not present

## 2021-04-19 DIAGNOSIS — Z1211 Encounter for screening for malignant neoplasm of colon: Secondary | ICD-10-CM

## 2021-04-19 DIAGNOSIS — D120 Benign neoplasm of cecum: Secondary | ICD-10-CM

## 2021-04-19 DIAGNOSIS — D123 Benign neoplasm of transverse colon: Secondary | ICD-10-CM

## 2021-04-19 HISTORY — PX: COLONOSCOPY: SHX174

## 2021-04-19 MED ORDER — SODIUM CHLORIDE 0.9 % IV SOLN: 500.0000 mL | Freq: Once | INTRAVENOUS | Status: DC

## 2021-04-19 NOTE — Op Note (Signed)
[facility] Patient Name: Stephen Gill Procedure Date: 04/19/2021 8:57 AM MRN: [id number] Endoscopist: Mallie Mussel L. Loletha Carrow , MD Age: 65 Referring MD:  Date of Birth: 1956-08-18 Gender: Male Account #: [id number] Procedure:                Colonoscopy Indications:              Screening for colorectal malignant neoplasm, This                            is the patient's first colonoscopy Medicines:                Monitored Anesthesia Care Procedure:                Pre-Anesthesia Assessment:                           - Prior to the procedure, a History and Physical                            was performed, and patient medications and                            allergies were reviewed. The patient's tolerance of                            previous anesthesia was also reviewed. The risks                            and benefits of the procedure and the sedation                            options and risks were discussed with the patient.                            All questions were answered, and informed consent                            was obtained. Prior Anticoagulants: The patient has                            taken no previous anticoagulant or antiplatelet                            agents. ASA Grade Assessment: II - A patient with                            mild systemic disease. After reviewing the risks                            and benefits, the patient was deemed in                            satisfactory condition to undergo the procedure.  After obtaining informed consent, the colonoscope                            was passed under direct vision. Throughout the                            procedure, the patient's blood pressure, pulse, and                            oxygen saturations were monitored continuously. The                            Olympus CF-HQ190L was introduced through the anus                            and advanced to the the cecum,  identified by                            appendiceal orifice and ileocecal valve. The                            colonoscopy was performed without difficulty. The                            patient tolerated the procedure well. The quality                            of the bowel preparation was excellent. The                            ileocecal valve, appendiceal orifice, and rectum                            were photographed. The bowel preparation used was                            SUPREP. Scope In: 9:04:43 AM Scope Out: 9:26:04 AM Scope Withdrawal Time: 0 hours 16 minutes 53 seconds  Total Procedure Duration: 0 hours 21 minutes 21 seconds  Findings:                 The perianal and digital rectal examinations were                            normal.                           Three sessile polyps were found in the transverse                            colon and cecum. The polyps were 2 to 8 mm in size.                            These polyps were removed with a cold snare.  Resection and retrieval were complete. (Jar 1)                           A 8 mm polyp was found in the proximal transverse                            colon. The polyp was sessile. The polyp was removed                            with a hot snare. Resection and retrieval were                            complete. (Jar 1)                           A 6 mm polyp was found in the rectum. The polyp was                            pedunculated. The polyp was removed with a hot                            snare. Resection and retrieval were complete. (Jar                            2)                           Two semi-sessile polyps were found in the rectum.                            The polyps were diminutive in size. These polyps                            were removed with a cold snare. Resection and                            retrieval were complete. (Jar 2)                           A single  diverticulum was found in the right colon.                           The exam was otherwise without abnormality on                            direct and retroflexion views. Complications:            No immediate complications. Estimated Blood Loss:     Estimated blood loss was minimal. Impression:               - Three 2 to 8 mm polyps in the transverse colon                            and in the cecum, removed  with a cold snare.                            Resected and retrieved.                           - One 8 mm polyp in the proximal transverse colon,                            removed with a hot snare. Resected and retrieved.                           - One 6 mm polyp in the rectum, removed with a hot                            snare. Resected and retrieved.                           - Two diminutive polyps in the rectum, removed with                            a cold snare. Resected and retrieved.                           - Diverticulosis in the right colon.                           - The examination was otherwise normal on direct                            and retroflexion views. Recommendation:           - Patient has a contact number available for                            emergencies. The signs and symptoms of potential                            delayed complications were discussed with the                            patient. Return to normal activities tomorrow.                            Written discharge instructions were provided to the                            patient.                           - Resume previous diet.                           - Continue present medications.                           -  Await pathology results.                           - Repeat colonoscopy is recommended for                            surveillance. The colonoscopy date will be                            determined after pathology results from today's                            exam  become available for review. Nekoda L. Loletha Carrow, MD 04/19/2021 9:34:06 AM This report has been signed electronically.

## 2021-04-19 NOTE — Progress Notes (Signed)
Report given to PACU, vss 

## 2021-04-19 NOTE — Progress Notes (Signed)
Called to room to assist during endoscopic procedure.  Patient ID and intended procedure confirmed with present staff. Received instructions for my participation in the procedure from the performing physician.  

## 2021-04-19 NOTE — Patient Instructions (Signed)
YOU HAD AN ENDOSCOPIC PROCEDURE TODAY AT THE [facility]:   Refer to the procedure report that was given to you for any specific questions about what was found during the examination.  If the procedure report does not answer your questions, please call your gastroenterologist to clarify.  If you requested that your care partner not be given the details of your procedure findings, then the procedure report has been included in a sealed envelope for you to review at your convenience later.  YOU SHOULD EXPECT: Some feelings of bloating in the abdomen. Passage of more gas than usual.  Walking can help get rid of the air that was put into your GI tract during the procedure and reduce the bloating. If you had a lower endoscopy (such as a colonoscopy or flexible sigmoidoscopy) you may notice spotting of blood in your stool or on the toilet paper. If you underwent a bowel prep for your procedure, you may not have a normal bowel movement for a few days.  Please Note:  You might notice some irritation and congestion in your nose or some drainage.  This is from the oxygen used during your procedure.  There is no need for concern and it should clear up in a day or so.  SYMPTOMS TO REPORT IMMEDIATELY:  Following lower endoscopy (colonoscopy or flexible sigmoidoscopy):  Excessive amounts of blood in the stool  Significant tenderness or worsening of abdominal pains  Swelling of the abdomen that is new, acute  Fever of 100F or higher   For urgent or emergent issues, a gastroenterologist can be reached at any hour by calling [phone]. Do not use MyChart messaging for urgent concerns.    DIET:  We do recommend a small meal at first, but then you may proceed to your regular diet.  Drink plenty of fluids but you should avoid alcoholic beverages for 24 hours.  MEDICATIONS:  Continue present medications.  Please see handouts given to you by your recovery nurse.  Thank you for allowing us to  provide for your healthcare needs today.  ACTIVITY:  You should plan to take it easy for the rest of today and you should NOT DRIVE or use heavy machinery until tomorrow (because of the sedation medicines used during the test).    FOLLOW UP: Our staff will call the number listed on your records 48-72 hours following your procedure to check on you and address any questions or concerns that you may have regarding the information given to you following your procedure. If we do not reach you, we will leave a message.  We will attempt to reach you two times.  During this call, we will ask if you have developed any symptoms of COVID 19. If you develop any symptoms (ie: fever, flu-like symptoms, shortness of breath, cough etc.) before then, please call [phone].  If you test positive for Covid 19 in the 2 weeks post procedure, please call and report this information to us.    If any biopsies were taken you will be contacted by phone or by letter within the next 1-3 weeks.  Please call us at [phone] if you have not heard about the biopsies in 3 weeks.    SIGNATURES/CONFIDENTIALITY: You and/or your care partner have signed paperwork which will be entered into your electronic medical record.  These signatures attest to the fact that that the information above on your After Visit Summary has been reviewed and is understood.  Full responsibility of the   confidentiality of this discharge information lies with you and/or your care-partner.  

## 2021-04-19 NOTE — Progress Notes (Signed)
VS-CW  Pt's states no medical or surgical changes since previsit or office visit.  

## 2021-04-21 ENCOUNTER — Telehealth: Payer: Self-pay

## 2021-04-21 ENCOUNTER — Encounter: Payer: Self-pay | Admitting: Gastroenterology

## 2021-04-21 ENCOUNTER — Telehealth: Payer: Self-pay | Admitting: *Deleted

## 2021-04-21 NOTE — Telephone Encounter (Signed)
Attempted f/u phone call. No answer. Left message. °

## 2021-04-21 NOTE — Telephone Encounter (Signed)
Second attempt follow up call to pt, lm on vm. 

## 2021-05-11 ENCOUNTER — Ambulatory Visit (INDEPENDENT_AMBULATORY_CARE_PROVIDER_SITE_OTHER): Payer: No Typology Code available for payment source | Admitting: Family Medicine

## 2021-05-11 ENCOUNTER — Encounter: Payer: Self-pay | Admitting: Family Medicine

## 2021-05-11 ENCOUNTER — Other Ambulatory Visit: Payer: Self-pay

## 2021-05-11 VITALS — BP 124/84 | HR 72 | Temp 97.7°F | Ht 76.0 in | Wt 223.2 lb

## 2021-05-11 DIAGNOSIS — M545 Low back pain, unspecified: Secondary | ICD-10-CM | POA: Diagnosis not present

## 2021-05-11 DIAGNOSIS — R109 Unspecified abdominal pain: Secondary | ICD-10-CM | POA: Diagnosis not present

## 2021-05-11 MED ORDER — MELOXICAM 15 MG PO TABS: 15.0000 mg | ORAL_TABLET | Freq: Every day | ORAL | 0 refills | Status: DC

## 2021-05-11 NOTE — Patient Instructions (Signed)

## 2021-05-11 NOTE — Progress Notes (Signed)
Musculoskeletal Exam  Patient: Stephen Gill DOB: Feb 20, 1956  DOS: 05/11/2021  SUBJECTIVE:  Chief Complaint:   Chief Complaint  Patient presents with   Back Pain    Back pain/left side    Stephen Gill is a 65 y.o.  male for evaluation and treatment of L side pain.   Onset:  4 days ago. No inj or change in activity.  Location: L lower side/low back Character:  aching, sharp Progression of issue:  has improved Associated symptoms: None Denies swelling, bruising, redness Treatment: to date has been: Cold, Icy Hot   Neurovascular symptoms: no  Past Medical History:  Diagnosis Date   Calcaneus fracture, right    Diabetes mellitus without complication ([place])    History of kidney stones    Kidney stone    Shingles     Objective: VITAL SIGNS: BP 124/84   Pulse 72   Temp 97.7 F (36.5 C) (Oral)   Ht '6\' 4"'$  (1.93 m)   Wt 223 lb 4 oz (101.3 kg)   SpO2 95%   BMI 27.17 kg/m  Constitutional: Well formed, well developed. No acute distress. Thorax & Lungs: No accessory muscle use Musculoskeletal: side/back.   Tenderness to palpation: Yes over left lateral obliques and lateral erector spinae muscle group on the left Deformity: no Ecchymosis: no No rashes Neurologic: Normal sensory function.  Gait is normal Psychiatric: Normal mood. Age appropriate judgment and insight. Alert & oriented x 3.    Assessment:  Acute left-sided low back pain without sciatica - Plan: meloxicam (MOBIC) 15 MG tablet  Side pain - Plan: meloxicam (MOBIC) 15 MG tablet  Plan: Stretches/exercises, heat, ice, Tylenol.  Meloxicam as above.  This should resolve but will refer to physical therapy if no improvement. F/u as originally scheduled with regular PCP. The patient voiced understanding and agreement to the plan.   [place], DO 05/11/21  11:46 AM

## 2021-05-12 ENCOUNTER — Ambulatory Visit: Payer: No Typology Code available for payment source | Admitting: Family

## 2021-06-03 ENCOUNTER — Telehealth: Payer: Self-pay

## 2021-06-03 MED ORDER — ROSUVASTATIN CALCIUM 10 MG PO TABS: 10.0000 mg | ORAL_TABLET | Freq: Every day | ORAL | 0 refills | Status: DC

## 2021-06-03 NOTE — Addendum Note (Signed)
Addended by: Kittie Plater, Mannie Wineland HUA on: 06/03/2021 04:24 PM   Modules accepted: Orders

## 2021-06-03 NOTE — Telephone Encounter (Signed)
Rx has been sent to pharmacy and when pt called back he scheduled a f/u appt for 06/07/21.

## 2021-06-03 NOTE — Telephone Encounter (Signed)
I have attempted to call pt and it went straight to VM.  Pt is due for DM F/u appt.

## 2021-06-07 ENCOUNTER — Ambulatory Visit: Payer: No Typology Code available for payment source | Admitting: Family

## 2021-06-08 ENCOUNTER — Other Ambulatory Visit: Payer: Self-pay | Admitting: *Deleted

## 2021-06-08 MED ORDER — METFORMIN HCL 500 MG PO TABS: 1000.0000 mg | ORAL_TABLET | Freq: Two times a day (BID) | ORAL | 0 refills | Status: DC

## 2021-06-10 ENCOUNTER — Other Ambulatory Visit: Payer: Self-pay

## 2021-06-10 ENCOUNTER — Ambulatory Visit (INDEPENDENT_AMBULATORY_CARE_PROVIDER_SITE_OTHER): Payer: No Typology Code available for payment source | Admitting: Family

## 2021-06-10 VITALS — BP 104/70 | HR 75 | Temp 97.9°F | Ht 76.0 in | Wt 220.2 lb

## 2021-06-10 DIAGNOSIS — E119 Type 2 diabetes mellitus without complications: Secondary | ICD-10-CM | POA: Diagnosis not present

## 2021-06-10 NOTE — Progress Notes (Signed)
Stephen Gill is a 65 y.o. male with the following history as recorded in EpicCare:  Patient Active Problem List   Diagnosis Date Noted   Primary osteoarthritis of left knee 03/09/2021   Patellofemoral pain syndrome of left knee 02/09/2021    Current Outpatient Medications  Medication Sig Dispense Refill   glimepiride (AMARYL) 4 MG tablet Take 1 tablet (4 mg total) by mouth daily before breakfast. 30 tablet 3   metFORMIN (GLUCOPHAGE) 500 MG tablet Take 2 tablets (1,000 mg total) by mouth 2 (two) times daily with a meal. 360 tablet 0   tamsulosin (FLOMAX) 0.4 MG CAPS capsule Take 1 capsule (0.4 mg total) by mouth at bedtime. 90 capsule 1   meloxicam (MOBIC) 15 MG tablet Take 1 tablet (15 mg total) by mouth daily. (Patient not taking: Reported on 06/10/2021) 30 tablet 0   No current facility-administered medications for this visit.    Allergies: Patient has no known allergies.  Past Medical History:  Diagnosis Date   Calcaneus fracture, right    Diabetes mellitus without complication ([place])    History of kidney stones    Kidney stone    Shingles     Past Surgical History:  Procedure Laterality Date   EXTRACORPOREAL SHOCK WAVE LITHOTRIPSY Left 07/28/2019   Procedure: EXTRACORPOREAL SHOCK WAVE LITHOTRIPSY (ESWL);  Surgeon: Lucas Mallow, MD;  Location: [facility];  Service: Urology;  Laterality: Left;   LITHOTRIPSY     ORIF CALCANEOUS FRACTURE Right 04/25/2015   Procedure: OPEN REDUCTION INTERNAL FIXATION (ORIF) RIGHT CALCANEOUS FRACTURE;  Surgeon: Newt Minion, MD;  Location: [facility];  Service: Orthopedics;  Laterality: Right;   URETEROSCOPY      Family History  Problem Relation Age of Onset   Hypertension Mother    Heart attack Brother    Stomach cancer Other    Rectal cancer Other    Esophageal cancer Other    Colon polyps Other    Colon cancer Other     Social History   Tobacco Use   Smoking status: Never   Smokeless tobacco: Never  Substance Use Topics   Alcohol use:  No    Subjective:  2 month follow up on type 2 diabetes; had to start Amaryl 4 mg daily; insurance would not cover newer agents and patient does not want injectables;  Denies any concerns for low blood sugar; Denies any chest pain, shortness of breath, blurred vision or headache.  Has felt better since stopping Crestor;    Objective:  Vitals:   06/10/21 1506  BP: 104/70  Pulse: 75  Temp: 97.9 F (36.6 C)  TempSrc: Oral  SpO2: 93%  Weight: 220 lb 3.2 oz (99.9 kg)  Height: 6' 4" (1.93 m)    General: Well developed, well nourished, in no acute distress  Skin : Warm and dry.  Head: Normocephalic and atraumatic  Lungs: Respirations unlabored; clear to auscultation bilaterally without wheeze, rales, rhonchi  CVS exam: normal rate and regular rhythm.  Neurologic: Alert and oriented; speech intact; face symmetrical; moves all extremities well; CNII-XII intact without focal deficit  Assessment:  1. Type 2 diabetes mellitus without complication, without long-term current use of insulin ([place])     Plan:  Update labs today; to consider adding Rybelsus; will consider starting new statin as well; follow up to be determined.   This visit occurred during the SARS-CoV-2 public health emergency.  Safety protocols were in place, including screening questions prior to the visit, additional usage of staff  PPE, and extensive cleaning of exam room while observing appropriate contact time as indicated for disinfecting solutions.    No follow-ups on file.  Orders Placed This Encounter  Procedures   Comp Met (CMET)   Hemoglobin A1c   Lipid panel    Requested Prescriptions    No prescriptions requested or ordered in this encounter

## 2021-06-11 LAB — COMPREHENSIVE METABOLIC PANEL
AG Ratio: 2 (calc) (ref 1.0–2.5)
ALT: 26 U/L (ref 9–46)
AST: 23 U/L (ref 10–35)
Albumin: 4.6 g/dL (ref 3.6–5.1)
Alkaline phosphatase (APISO): 107 U/L (ref 35–144)
BUN/Creatinine Ratio: 22 (calc) (ref 6–22)
BUN: 29 mg/dL — ABNORMAL HIGH (ref 7–25)
CO2: 29 mmol/L (ref 20–32)
Calcium: 10.1 mg/dL (ref 8.6–10.3)
Chloride: 104 mmol/L (ref 98–110)
Creat: 1.31 mg/dL (ref 0.70–1.35)
Globulin: 2.3 g/dL (calc) (ref 1.9–3.7)
Glucose, Bld: 96 mg/dL (ref 65–99)
Potassium: 4.3 mmol/L (ref 3.5–5.3)
Sodium: 140 mmol/L (ref 135–146)
Total Bilirubin: 1.7 mg/dL — ABNORMAL HIGH (ref 0.2–1.2)
Total Protein: 6.9 g/dL (ref 6.1–8.1)

## 2021-06-11 LAB — LIPID PANEL
Cholesterol: 194 mg/dL (ref <200)
HDL: 46 mg/dL (ref >=40)
LDL Cholesterol (Calc): 133 mg/dL (calc) — ABNORMAL HIGH
Non-HDL Cholesterol (Calc): 148 mg/dL (calc) — ABNORMAL HIGH (ref <130)
Total CHOL/HDL Ratio: 4.2 (calc) (ref <5.0)
Triglycerides: 48 mg/dL (ref <150)

## 2021-06-11 LAB — HEMOGLOBIN A1C
Hgb A1c MFr Bld: 7.7 % of total Hgb — ABNORMAL HIGH (ref <5.7)
Mean Plasma Glucose: 174 mg/dL
eAG (mmol/L): 9.7 mmol/L

## 2021-06-13 ENCOUNTER — Other Ambulatory Visit: Payer: Self-pay | Admitting: Family

## 2021-06-13 MED ORDER — ATORVASTATIN CALCIUM 20 MG PO TABS: 20.0000 mg | ORAL_TABLET | Freq: Every day | ORAL | 3 refills | Status: DC

## 2021-06-14 ENCOUNTER — Other Ambulatory Visit: Payer: Self-pay | Admitting: *Deleted

## 2021-06-14 MED ORDER — GLIMEPIRIDE 4 MG PO TABS: 4.0000 mg | ORAL_TABLET | Freq: Every day | ORAL | 1 refills | Status: DC

## 2021-06-21 ENCOUNTER — Ambulatory Visit: Payer: No Typology Code available for payment source | Admitting: Family

## 2021-09-13 ENCOUNTER — Encounter: Payer: Self-pay | Admitting: Family

## 2021-09-13 ENCOUNTER — Ambulatory Visit (INDEPENDENT_AMBULATORY_CARE_PROVIDER_SITE_OTHER): Payer: No Typology Code available for payment source | Admitting: Family

## 2021-09-13 VITALS — BP 132/80 | HR 76 | Temp 97.4°F | Ht 76.0 in | Wt 222.6 lb

## 2021-09-13 DIAGNOSIS — Z87438 Personal history of other diseases of male genital organs: Secondary | ICD-10-CM | POA: Diagnosis not present

## 2021-09-13 DIAGNOSIS — E119 Type 2 diabetes mellitus without complications: Secondary | ICD-10-CM

## 2021-09-13 NOTE — Progress Notes (Signed)
Stephen Gill is a 65 y.o. male with the following history as recorded in EpicCare:  Patient Active Problem List   Diagnosis Date Noted   Primary osteoarthritis of left knee 03/09/2021   Patellofemoral pain syndrome of left knee 02/09/2021    Current Outpatient Medications  Medication Sig Dispense Refill   atorvastatin (LIPITOR) 20 MG tablet Take 1 tablet (20 mg total) by mouth daily. 90 tablet 3   metFORMIN (GLUCOPHAGE) 500 MG tablet Take 2 tablets (1,000 mg total) by mouth 2 (two) times daily with a meal. 360 tablet 0   tamsulosin (FLOMAX) 0.4 MG CAPS capsule Take 1 capsule (0.4 mg total) by mouth at bedtime. 90 capsule 1   glimepiride (AMARYL) 4 MG tablet Take 1 tablet (4 mg total) by mouth daily before breakfast. (Patient not taking: Reported on 09/13/2021) 90 tablet 1   meloxicam (MOBIC) 15 MG tablet Take 1 tablet (15 mg total) by mouth daily. (Patient not taking: Reported on 06/10/2021) 30 tablet 0   No current facility-administered medications for this visit.    Allergies: Patient has no known allergies.  Past Medical History:  Diagnosis Date   Calcaneus fracture, right    Diabetes mellitus without complication ([place])    History of kidney stones    Kidney stone    Shingles     Past Surgical History:  Procedure Laterality Date   EXTRACORPOREAL SHOCK WAVE LITHOTRIPSY Left 07/28/2019   Procedure: EXTRACORPOREAL SHOCK WAVE LITHOTRIPSY (ESWL);  Surgeon: Lucas Mallow, MD;  [facility];  Service: Urology;  Laterality: Left;   LITHOTRIPSY     ORIF CALCANEOUS FRACTURE Right 04/25/2015   Procedure: OPEN REDUCTION INTERNAL FIXATION (ORIF) RIGHT CALCANEOUS FRACTURE;  Surgeon: Newt Minion, MD;  Location: [facility];  Service: Orthopedics;  Laterality: Right;   URETEROSCOPY      Family History  Problem Relation Age of Onset   Hypertension Mother    Heart attack Brother    Stomach cancer Other    Rectal cancer Other    Esophageal cancer Other    Colon polyps Other    Colon  cancer Other     Social History   Tobacco Use   Smoking status: Never   Smokeless tobacco: Never  Substance Use Topics   Alcohol use: No    Subjective:  3 month follow up on Type 2 Diabetes; notes he is only taking Metformin bid; is tolerating Lipitor well; is not checking is blood sugars regularly;  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  Head: Normocephalic and atraumatic  Eyes: Sclera and conjunctiva clear; pupils round and reactive to light; extraocular movements intact  Ears: External normal; canals clear; tympanic membranes normal  Oropharynx: Pink, supple. No suspicious lesions  Neck: Supple without thyromegaly, adenopathy  Lungs: Respirations unlabored; clear to auscultation bilaterally without wheeze, rales, rhonchi  CVS exam: normal rate and regular rhythm.  Neurologic: Alert  and oriented; speech intact; face symmetrical; moves all extremities well; CNII-XII intact without focal deficit   Assessment:  1. 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  Safety protocols were in  place, including screening questions prior to the visit, additional usage of staff PPE, and extensive cleaning of exam room while observing appropriate contact time as indicated for disinfecting solutions.    No follow-ups on file.  Orders Placed This Encounter  Procedures   Comp Met (CMET)   CBC with Differential/Platelet   Hemoglobin A1c    Requested Prescriptions    No prescriptions requested or ordered in this encounter

## 2021-09-14 LAB — CBC WITH DIFFERENTIAL/PLATELET
Basophils Absolute: 0.1 10*3/uL (ref 0.0–0.1)
Basophils Relative: 1.2 % (ref 0.0–3.0)
Eosinophils Absolute: 0.1 10*3/uL (ref 0.0–0.7)
Eosinophils Relative: 2.4 % (ref 0.0–5.0)
HCT: 39.7 % (ref 39.0–52.0)
Hemoglobin: 12.9 g/dL — ABNORMAL LOW (ref 13.0–17.0)
Lymphocytes Relative: 33.4 % (ref 12.0–46.0)
Lymphs Abs: 1.9 10*3/uL (ref 0.7–4.0)
MCHC: 32.4 g/dL (ref 30.0–36.0)
MCV: 92.4 fl (ref 78.0–100.0)
Monocytes Absolute: 0.4 10*3/uL (ref 0.1–1.0)
Monocytes Relative: 7.5 % (ref 3.0–12.0)
Neutro Abs: 3.2 10*3/uL (ref 1.4–7.7)
Neutrophils Relative %: 55.5 % (ref 43.0–77.0)
Platelets: 165 10*3/uL (ref 150.0–400.0)
RBC: 4.29 Mil/uL (ref 4.22–5.81)
RDW: 13 % (ref 11.5–15.5)
WBC: 5.7 10*3/uL (ref 4.0–10.5)

## 2021-09-14 LAB — COMPREHENSIVE METABOLIC PANEL
ALT: 21 U/L (ref 0–53)
AST: 17 U/L (ref 0–37)
Albumin: 4.4 g/dL (ref 3.5–5.2)
Alkaline Phosphatase: 115 U/L (ref 39–117)
BUN: 19 mg/dL (ref 6–23)
CO2: 31 mEq/L (ref 19–32)
Calcium: 9.7 mg/dL (ref 8.4–10.5)
Chloride: 103 mEq/L (ref 96–112)
Creatinine, Ser: 1.09 mg/dL (ref 0.40–1.50)
GFR: 72.38 mL/min (ref >60.00)
Glucose, Bld: 149 mg/dL — ABNORMAL HIGH (ref 70–99)
Potassium: 4 mEq/L (ref 3.5–5.1)
Sodium: 139 mEq/L (ref 135–145)
Total Bilirubin: 1.3 mg/dL — ABNORMAL HIGH (ref 0.2–1.2)
Total Protein: 6.8 g/dL (ref 6.0–8.3)

## 2021-09-14 LAB — HEMOGLOBIN A1C: Hgb A1c MFr Bld: 8.2 % — ABNORMAL HIGH (ref 4.6–6.5)

## 2021-09-15 ENCOUNTER — Other Ambulatory Visit: Payer: Self-pay | Admitting: Family

## 2021-09-15 MED ORDER — TAMSULOSIN HCL 0.4 MG PO CAPS: 0.4000 mg | ORAL_CAPSULE | Freq: Every day | ORAL | 1 refills | Status: DC

## 2021-09-15 MED ORDER — RYBELSUS 3 MG PO TABS: 3.0000 mg | ORAL_TABLET | Freq: Every day | ORAL | 0 refills | Status: DC

## 2021-09-15 NOTE — Addendum Note (Signed)
Addended by: Kittie Plater, Leilani Cespedes HUA on: 09/15/2021 04:24 PM   Modules accepted: Orders

## 2021-09-16 ENCOUNTER — Other Ambulatory Visit: Payer: Self-pay | Admitting: Family

## 2021-09-16 MED ORDER — FREESTYLE LIBRE 3 SENSOR MISC: 1.0000 [IU] | 6 refills | Status: DC

## 2021-09-16 MED ORDER — FREESTYLE LIBRE READER DEVI: 6 refills | Status: DC

## 2021-09-16 NOTE — Telephone Encounter (Signed)
pt would like for someone to return his phone call regarding freestyle 3. please advise.

## 2021-09-16 NOTE — Telephone Encounter (Signed)
I have called pt back and relayed to him that his insurance company may not pay for this, but we will send in the rx to run it and double check. If the price is not reasonable then he was told not to get it. Pt wanted to try the Freestyle 3 to check his blood sugar.   Rx pended for provider.

## 2021-12-16 ENCOUNTER — Ambulatory Visit (INDEPENDENT_AMBULATORY_CARE_PROVIDER_SITE_OTHER): Payer: No Typology Code available for payment source | Admitting: Family

## 2021-12-16 ENCOUNTER — Encounter: Payer: Self-pay | Admitting: Family

## 2021-12-16 VITALS — BP 122/82 | HR 65 | Temp 98.2°F | Ht 76.0 in | Wt 218.0 lb

## 2021-12-16 DIAGNOSIS — E119 Type 2 diabetes mellitus without complications: Secondary | ICD-10-CM | POA: Diagnosis not present

## 2021-12-16 LAB — POCT GLYCOSYLATED HEMOGLOBIN (HGB A1C): Hemoglobin A1C: 9.4 % — AB (ref 4.0–5.6)

## 2021-12-16 MED ORDER — METFORMIN HCL 1000 MG PO TABS: 1000.0000 mg | ORAL_TABLET | Freq: Two times a day (BID) | ORAL | 1 refills | Status: DC

## 2021-12-16 MED ORDER — GLIMEPIRIDE 2 MG PO TABS: 2.0000 mg | ORAL_TABLET | Freq: Every day | ORAL | 1 refills | Status: DC

## 2021-12-16 MED ORDER — FREESTYLE LIBRE 3 SENSOR MISC: 1.0000 [IU] | 6 refills | Status: AC

## 2021-12-16 NOTE — Patient Instructions (Signed)
Glucerna Diabetic shakes- please try this for "breakfast" ?

## 2021-12-16 NOTE — Progress Notes (Signed)
?Stephen Gill is a 66 y.o. male with the following history as recorded in EpicCare:  ?Patient Active Problem List  ? Diagnosis Date Noted  ? Primary osteoarthritis of left knee 03/09/2021  ? Patellofemoral pain syndrome of left knee 02/09/2021  ?  ?Current Outpatient Medications  ?Medication Sig Dispense Refill  ? atorvastatin (LIPITOR) 20 MG tablet Take 1 tablet (20 mg total) by mouth daily. 90 tablet 3  ? Continuous Blood Gluc Receiver (FREESTYLE LIBRE READER) DEVI Use as directed to check blood glucose 1 each 6  ? glimepiride (AMARYL) 2 MG tablet Take 1 tablet (2 mg total) by mouth daily before breakfast. 90 tablet 1  ? tamsulosin (FLOMAX) 0.4 MG CAPS capsule Take 1 capsule (0.4 mg total) by mouth at bedtime. 90 capsule 1  ? Continuous Blood Gluc Sensor (FREESTYLE LIBRE 3 SENSOR) MISC Apply 1 Units topically as directed. Place 1 sensor on the skin every 14 days. Use to check glucose continuously 1 each 6  ? metFORMIN (GLUCOPHAGE) 1000 MG tablet Take 1 tablet (1,000 mg total) by mouth 2 (two) times daily with a meal. 180 tablet 1  ? ?No current facility-administered medications for this visit.  ?  ?Allergies: Patient has no known allergies.  ?Past Medical History:  ?Diagnosis Date  ? Calcaneus fracture, right   ? Diabetes mellitus without complication ([place])   ? History of kidney stones   ? Kidney stone   ? Shingles   ?  ?Past Surgical History:  ?Procedure Laterality Date  ? EXTRACORPOREAL SHOCK WAVE LITHOTRIPSY Left 07/28/2019  ? Procedure: EXTRACORPOREAL SHOCK WAVE LITHOTRIPSY (ESWL);  Surgeon: Lucas Mallow, MD;  [facility];  Service: Urology;  Laterality: Left;  ? LITHOTRIPSY    ? ORIF CALCANEOUS FRACTURE Right 04/25/2015  ? Procedure: OPEN REDUCTION INTERNAL FIXATION (ORIF) RIGHT CALCANEOUS FRACTURE;  Surgeon: Newt Minion, MD;  Location: [facility];  Service: Orthopedics;  Laterality: Right;  ? URETEROSCOPY    ?  ?Family History  ?Problem Relation Age of Onset  ? Hypertension Mother   ? Heart attack  Brother   ? Stomach cancer Other   ? Rectal cancer Other   ? Esophageal cancer Other   ? Colon polyps Other   ? Colon cancer Other   ?  ?Social History  ? ?Tobacco Use  ? Smoking status: Never  ? Smokeless tobacco: Never  ?Substance Use Topics  ? Alcohol use: No  ?  ?Subjective:  ? 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 ? ?  ?Objective:  ?Vitals:  ? 12/16/21 1511  ?BP: 122/82  ?Pulse: 65  ?Temp: 98.2 ?F (36.8 ?C)  ?TempSrc: Oral  ?SpO2: 98%  ?Weight: 218 lb (98.9 kg)  ?Height: '6\' 4"'$  (1.93 m)  ?  ?General: Well developed, well nourished, in no acute distress  ?Skin : Warm and dry.  ?Head: Normocephalic and atraumatic  ?Lungs: Respirations unlabored; clear to auscultation bilaterally without wheeze, rales, rhonchi  ?CVS exam: normal rate and regular rhythm.  ?Vessels: Symmetric bilaterally  ?Neurologic: Alert and oriented; speech intact; face symmetrical; moves all  extremities well; CNII-XII intact without focal deficit  ? ?Assessment:  ?1. Type 2 diabetes mellitus without complication, without long-term current use of insulin ([place])   ?  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  ? ?This visit occurred during the SARS-CoV-2 public health emergency.  Safety protocols were in place, including screening questions prior to the visit, additional usage of staff PPE, and extensive cleaning of exam  room while observing appropriate contact time as indicated for disinfecting solutions.  ? ? ?No follow-ups on file.  ?Orders Placed This Encounter  ?Procedures  ? POCT glycosylated hemoglobin (Hb A1C)  ?  ?Requested Prescriptions  ? ?Signed Prescriptions Disp Refills  ? metFORMIN (GLUCOPHAGE) 1000 MG tablet 180 tablet 1  ?  Sig: Take 1 tablet (1,000 mg total) by mouth 2 (two) times daily with a meal.  ? glimepiride (AMARYL) 2 MG tablet 90 tablet 1  ?  Sig: Take 1 tablet (2 mg total) by mouth daily before breakfast.  ? Continuous Blood Gluc Sensor (FREESTYLE LIBRE 3 SENSOR) MISC 1 each 6  ?  Sig: Apply 1 Units topically as directed. Place 1 sensor on the skin every 14 days. Use to check glucose continuously  ?  ? ?

## 2022-01-24 ENCOUNTER — Ambulatory Visit (INDEPENDENT_AMBULATORY_CARE_PROVIDER_SITE_OTHER): Payer: No Typology Code available for payment source | Admitting: Family

## 2022-01-24 ENCOUNTER — Encounter: Payer: Self-pay | Admitting: Family

## 2022-01-24 VITALS — BP 122/80 | HR 69 | Temp 98.3°F | Ht 76.0 in | Wt 217.0 lb

## 2022-01-24 DIAGNOSIS — R519 Headache, unspecified: Secondary | ICD-10-CM

## 2022-01-24 DIAGNOSIS — R350 Frequency of micturition: Secondary | ICD-10-CM

## 2022-01-24 NOTE — Progress Notes (Signed)
[name] is a 66 y.o. male with the following history as recorded in EpicCare:  ?Patient Active Problem List  ? Diagnosis Date Noted  ? Primary osteoarthritis of left knee 03/09/2021  ? Patellofemoral pain syndrome of left knee 02/09/2021  ?  ?Current Outpatient Medications  ?Medication Sig Dispense Refill  ? atorvastatin (LIPITOR) 20 MG tablet Take 1 tablet (20 mg total) by mouth daily. 90 tablet 3  ? Continuous Blood Gluc Receiver (FREESTYLE LIBRE READER) DEVI Use as directed to check blood glucose 1 each 6  ? Continuous Blood Gluc Sensor (FREESTYLE LIBRE 3 SENSOR) MISC Apply 1 Units topically as directed. Place 1 sensor on the skin every 14 days. Use to check glucose continuously 1 each 6  ? glimepiride (AMARYL) 2 MG tablet Take 1 tablet (2 mg total) by mouth daily before breakfast. 90 tablet 1  ? metFORMIN (GLUCOPHAGE) 1000 MG tablet Take 1 tablet (1,000 mg total) by mouth 2 (two) times daily with a meal. 180 tablet 1  ? tamsulosin (FLOMAX) 0.4 MG CAPS capsule Take 1 capsule (0.4 mg total) by mouth at bedtime. 90 capsule 1  ? ?No current facility-administered medications for this visit.  ?  ?Allergies: Patient has no known allergies.  ?Past Medical History:  ?Diagnosis Date  ? Calcaneus fracture, right   ? Diabetes mellitus without complication ([place])   ? History of kidney stones   ? Kidney stone   ? Shingles   ?  ?Past Surgical History:  ?Procedure Laterality Date  ? EXTRACORPOREAL SHOCK WAVE LITHOTRIPSY Left 07/28/2019  ? Procedure: EXTRACORPOREAL SHOCK WAVE LITHOTRIPSY (ESWL);  Surgeon: Lucas Mallow, MD;  [facility];  Service: Urology;  Laterality: Left;  ? LITHOTRIPSY    ? ORIF CALCANEOUS FRACTURE Right 04/25/2015  ? Procedure: OPEN REDUCTION INTERNAL FIXATION (ORIF) RIGHT CALCANEOUS FRACTURE;  Surgeon: Newt Minion, MD;  Location: [facility];  Service: Orthopedics;  Laterality: Right;  ? URETEROSCOPY    ?  ?Family History  ?Problem Relation Age of Onset  ? Hypertension Mother   ? Heart attack  Brother   ? Stomach cancer Other   ? Rectal cancer Other   ? Esophageal cancer Other   ? Colon polyps Other   ? Colon cancer Other   ?  ?Social History  ? ?Tobacco Use  ? Smoking status: Never  ? Smokeless tobacco: Never  ?Substance Use Topics  ? Alcohol use: No  ?  ?Subjective:  ? ?Concerned for episode of intermittent headache over the past 2 weeks; states that at least 2x in the past 2 weeks has had a sharp frontal headache; symptoms lasted for approximately 3 hours with initial headache and then yesterday for about 45 minutes; did experience some blurred vision with each headache; admits is overdue to have eye exam done; also complaining of increased urination recently;  ?Notes that he is feeling better on combination of Metformin and Glimepiride; admits that is not eating for most of the day while on the medication; takes medication in the am and then does not eat until later in the evening;  ? ? ? ? ? ?Objective:  ?Vitals:  ? 01/24/22 1439  ?BP: 122/80  ?Pulse: 69  ?Temp: 98.3 ?F (36.8 ?C)  ?TempSrc: Oral  ?SpO2: 96%  ?Weight: 217 lb (98.4 kg)  ?Height: _0  (1.93 m)  ?  ?General: Well developed, well nourished, in no acute distress  ?Skin : Warm and dry.  ?Head: Normocephalic and atraumatic  ?Eyes: Sclera and  conjunctiva clear; pupils round and reactive to light; extraocular movements intact  ?Ears: External normal; canals clear; tympanic membranes normal  ?Oropharynx: Pink, supple. No suspicious lesions  ?Neck: Supple without thyromegaly, adenopathy  ?Lungs: Respirations unlabored; clear to auscultation bilaterally without wheeze, rales, rhonchi  ?CVS exam: normal rate and regular rhythm.  ?Neurologic: Alert and oriented; speech intact; face symmetrical; moves all extremities well; CNII-XII intact without focal deficit  ? ?Assessment:  ?1. Nonintractable headache, unspecified chronicity pattern, unspecified headache type   ?2. Urinary frequency   ?  ?Plan:  ?Update head CT; check CBC, CMP today; check urine  culture, PSA; follow up to be determined;  ?Check Hgba1c today; stressed need to eat regular meals with his current medication regimen;  ?Patient agrees to see his eye doctor soon as well;  ?Follow up to be determined;  ? ?No follow-ups on file.  ?Orders Placed This Encounter  ?Procedures  ? Urine Culture  ? CT HEAD WO CONTRAST (5MM)  ?  Standing Status:   Future  ?  Standing Expiration Date:   01/25/2023  ?  Scheduling Instructions:  ?   Please schedule for 01/25/2022 after 3 pm;  ?  Order Specific Question:   Preferred imaging location?  ?  Answer:   [facility]  ? CBC with Differential/Platelet  ? Comp Met (CMET)  ? Hemoglobin A1c  ? PSA  ?  ?Requested Prescriptions  ? ? No prescriptions requested or ordered in this encounter  ?  ? ?

## 2022-01-25 ENCOUNTER — Ambulatory Visit (HOSPITAL_BASED_OUTPATIENT_CLINIC_OR_DEPARTMENT_OTHER)
Admission: RE | Admit: 2022-01-25 | Discharge: 2022-01-25 | Disposition: A | Discharge status: Home / Self Care | Payer: No Typology Code available for payment source | Source: Ambulatory Visit | Attending: Family | Admitting: Family

## 2022-01-25 DIAGNOSIS — R519 Headache, unspecified: Secondary | ICD-10-CM | POA: Diagnosis present

## 2022-01-25 LAB — CBC WITH DIFFERENTIAL/PLATELET
Basophils Absolute: 0.1 10*3/uL (ref 0.0–0.1)
Basophils Relative: 1.2 % (ref 0.0–3.0)
Eosinophils Absolute: 0.1 10*3/uL (ref 0.0–0.7)
Eosinophils Relative: 1.4 % (ref 0.0–5.0)
HCT: 38.7 % — ABNORMAL LOW (ref 39.0–52.0)
Hemoglobin: 12.6 g/dL — ABNORMAL LOW (ref 13.0–17.0)
Lymphocytes Relative: 31.8 % (ref 12.0–46.0)
Lymphs Abs: 1.5 10*3/uL (ref 0.7–4.0)
MCHC: 32.5 g/dL (ref 30.0–36.0)
MCV: 92.5 fl (ref 78.0–100.0)
Monocytes Absolute: 0.4 10*3/uL (ref 0.1–1.0)
Monocytes Relative: 8 % (ref 3.0–12.0)
Neutro Abs: 2.7 10*3/uL (ref 1.4–7.7)
Neutrophils Relative %: 57.6 % (ref 43.0–77.0)
Platelets: 156 10*3/uL (ref 150.0–400.0)
RBC: 4.18 Mil/uL — ABNORMAL LOW (ref 4.22–5.81)
RDW: 13.2 % (ref 11.5–15.5)
WBC: 4.6 10*3/uL (ref 4.0–10.5)

## 2022-01-25 LAB — URINE CULTURE
MICRO NUMBER:: 13340041
SPECIMEN QUALITY:: ADEQUATE

## 2022-01-25 LAB — COMPREHENSIVE METABOLIC PANEL
ALT: 16 U/L (ref 0–53)
AST: 14 U/L (ref 0–37)
Albumin: 4.5 g/dL (ref 3.5–5.2)
Alkaline Phosphatase: 103 U/L (ref 39–117)
BUN: 18 mg/dL (ref 6–23)
CO2: 30 mEq/L (ref 19–32)
Calcium: 9.5 mg/dL (ref 8.4–10.5)
Chloride: 102 mEq/L (ref 96–112)
Creatinine, Ser: 1.18 mg/dL (ref 0.40–1.50)
GFR: 65.64 mL/min (ref >60.00)
Glucose, Bld: 143 mg/dL — ABNORMAL HIGH (ref 70–99)
Potassium: 4.2 mEq/L (ref 3.5–5.1)
Sodium: 138 mEq/L (ref 135–145)
Total Bilirubin: 1.8 mg/dL — ABNORMAL HIGH (ref 0.2–1.2)
Total Protein: 6.7 g/dL (ref 6.0–8.3)

## 2022-01-25 LAB — PSA: PSA: 1.16 ng/mL (ref 0.10–4.00)

## 2022-01-25 LAB — HEMOGLOBIN A1C: Hgb A1c MFr Bld: 8.6 % — ABNORMAL HIGH (ref 4.6–6.5)

## 2022-01-26 NOTE — Progress Notes (Signed)
Patient notified with lab results in message regarding head Ct; wil re-check labs in June.

## 2022-02-02 ENCOUNTER — Ambulatory Visit (INDEPENDENT_AMBULATORY_CARE_PROVIDER_SITE_OTHER): Payer: No Typology Code available for payment source | Admitting: Family

## 2022-02-02 ENCOUNTER — Encounter: Payer: Self-pay | Admitting: Family

## 2022-02-02 VITALS — BP 100/70 | HR 80 | Temp 98.3°F | Ht 76.0 in | Wt 214.2 lb

## 2022-02-02 DIAGNOSIS — J069 Acute upper respiratory infection, unspecified: Secondary | ICD-10-CM

## 2022-02-02 DIAGNOSIS — R059 Cough, unspecified: Secondary | ICD-10-CM

## 2022-02-02 LAB — POC COVID19 BINAXNOW: SARS Coronavirus 2 Ag: NEGATIVE

## 2022-02-02 MED ORDER — AZELASTINE HCL 0.1 % NA SOLN: 2.0000 | Freq: Two times a day (BID) | NASAL | 0 refills | Status: DC

## 2022-02-02 MED ORDER — BENZONATATE 100 MG PO CAPS: 100.0000 mg | ORAL_CAPSULE | Freq: Three times a day (TID) | ORAL | 0 refills | Status: DC | PRN

## 2022-02-02 NOTE — Progress Notes (Signed)
?Stephen Gill is a 66 y.o. male with the following history as recorded in EpicCare:  ?Patient Active Problem List  ? Diagnosis Date Noted  ? Primary osteoarthritis of left knee 03/09/2021  ? Patellofemoral pain syndrome of left knee 02/09/2021  ?  ?Current Outpatient Medications  ?Medication Sig Dispense Refill  ? atorvastatin (LIPITOR) 20 MG tablet Take 1 tablet (20 mg total) by mouth daily. 90 tablet 3  ? azelastine (ASTELIN) 0.1 % nasal spray Place 2 sprays into both nostrils 2 (two) times daily. Use in each nostril as directed 30 mL 0  ? benzonatate (TESSALON) 100 MG capsule Take 1 capsule (100 mg total) by mouth 3 (three) times daily as needed for cough. 30 capsule 0  ? Continuous Blood Gluc Receiver (FREESTYLE LIBRE READER) DEVI Use as directed to check blood glucose 1 each 6  ? Continuous Blood Gluc Sensor (FREESTYLE LIBRE 3 SENSOR) MISC Apply 1 Units topically as directed. Place 1 sensor on the skin every 14 days. Use to check glucose continuously 1 each 6  ? glimepiride (AMARYL) 2 MG tablet Take 1 tablet (2 mg total) by mouth daily before breakfast. 90 tablet 1  ? metFORMIN (GLUCOPHAGE) 1000 MG tablet Take 1 tablet (1,000 mg total) by mouth 2 (two) times daily with a meal. 180 tablet 1  ? tamsulosin (FLOMAX) 0.4 MG CAPS capsule Take 1 capsule (0.4 mg total) by mouth at bedtime. 90 capsule 1  ? ?No current facility-administered medications for this visit.  ?  ?Allergies: Patient has no known allergies.  ?Past Medical History:  ?Diagnosis Date  ? Calcaneus fracture, right   ? Diabetes mellitus without complication ([place])   ? History of kidney stones   ? Kidney stone   ? Shingles   ?  ?Past Surgical History:  ?Procedure Laterality Date  ? EXTRACORPOREAL SHOCK WAVE LITHOTRIPSY Left 07/28/2019  ? Procedure: EXTRACORPOREAL SHOCK WAVE LITHOTRIPSY (ESWL);  Surgeon: Lucas Mallow, MD;  [facility];  Service: Urology;  Laterality: Left;  ? LITHOTRIPSY    ? ORIF CALCANEOUS FRACTURE Right 04/25/2015  ?  Procedure: OPEN REDUCTION INTERNAL FIXATION (ORIF) RIGHT CALCANEOUS FRACTURE;  Surgeon: Newt Minion, MD;  Location: [facility];  Service: Orthopedics;  Laterality: Right;  ? URETEROSCOPY    ?  ?Family History  ?Problem Relation Age of Onset  ? Hypertension Mother   ? Heart attack Brother   ? Stomach cancer Other   ? Rectal cancer Other   ? Esophageal cancer Other   ? Colon polyps Other   ? Colon cancer Other   ?  ?Social History  ? ?Tobacco Use  ? Smoking status: Never  ? Smokeless tobacco: Never  ?Substance Use Topics  ? Alcohol use: No  ?  ?Subjective:  ? ?Cold symptoms x 4 days; + cough/ congestion; no fever, no body aches; no difficulty breathing; up at night coughing; has not taken COVID test- requesting test done in office;  ? ? ? ?Objective:  ?Vitals:  ? 02/02/22 1511  ?BP: 100/70  ?Pulse: 80  ?Temp: 98.3 ?F (36.8 ?C)  ?TempSrc: Oral  ?SpO2: 96%  ?Weight: 214 lb 3.2 oz (97.2 kg)  ?Height: '6\' 4"'$  (1.93 m)  ?  ?General: Well developed, well nourished, in no acute distress  ?Skin : Warm and dry.  ?Head: Normocephalic and atraumatic  ?Eyes: Sclera and conjunctiva clear; pupils round and reactive to light; extraocular movements intact  ?Ears: External normal; canals clear; tympanic membranes normal  ?Oropharynx: Pink, supple.  No suspicious lesions  ?Neck: Supple without thyromegaly, adenopathy  ?Lungs: Respirations unlabored; clear to auscultation bilaterally without wheeze, rales, rhonchi  ?CVS exam: normal rate and regular rhythm.  ?Neurologic: Alert and oriented; speech intact; face symmetrical; moves all extremities well; CNII-XII intact without focal deficit  ? ?Assessment:  ?1. Cough, unspecified type   ?2. Viral URI   ?  ?Plan:  ?COVID test is negative; reassurance; Rx for Astelin and Tessalon; increase fluids, rest and follow up worse, no better.  ? ?No follow-ups on file.  ?Orders Placed This Encounter  ?Procedures  ? [place] COVID-19  ?  Order Specific Question:   Previously tested for COVID-19  ?  Answer:   No   ?  Order Specific Question:   Resident in a congregate (group) care setting  ?  Answer:   No  ?  Order Specific Question:   Employed in healthcare setting  ?  Answer:   No  ?  ?Requested Prescriptions  ? ?Signed Prescriptions Disp Refills  ? benzonatate (TESSALON) 100 MG capsule 30 capsule 0  ?  Sig: Take 1 capsule (100 mg total) by mouth 3 (three) times daily as needed for cough.  ? azelastine (ASTELIN) 0.1 % nasal spray 30 mL 0  ?  Sig: Place 2 sprays into both nostrils 2 (two) times daily. Use in each nostril as directed  ?  ? ?

## 2022-02-21 ENCOUNTER — Other Ambulatory Visit: Payer: Self-pay | Admitting: Family

## 2022-03-10 ENCOUNTER — Ambulatory Visit: Payer: No Typology Code available for payment source | Admitting: Family

## 2022-03-16 ENCOUNTER — Ambulatory Visit: Payer: No Typology Code available for payment source | Admitting: Family

## 2022-04-09 ENCOUNTER — Other Ambulatory Visit: Payer: Self-pay | Admitting: Family

## 2022-04-09 DIAGNOSIS — Z87438 Personal history of other diseases of male genital organs: Secondary | ICD-10-CM

## 2022-04-25 ENCOUNTER — Ambulatory Visit (INDEPENDENT_AMBULATORY_CARE_PROVIDER_SITE_OTHER): Payer: No Typology Code available for payment source | Admitting: Family

## 2022-04-25 ENCOUNTER — Encounter: Payer: Self-pay | Admitting: Family

## 2022-04-25 VITALS — BP 110/70 | HR 66 | Resp 20 | Ht 76.0 in | Wt 215.0 lb

## 2022-04-25 DIAGNOSIS — E119 Type 2 diabetes mellitus without complications: Secondary | ICD-10-CM | POA: Diagnosis not present

## 2022-04-25 MED ORDER — FLUCONAZOLE 100 MG PO TABS: 100.0000 mg | ORAL_TABLET | Freq: Every day | ORAL | 0 refills | Status: DC

## 2022-04-25 NOTE — Progress Notes (Signed)
Stephen Gill is a 66 y.o. male with the following history as recorded in Epic[employer]:  Patient Active Problem List   Diagnosis Date Noted   Primary osteoarthritis of left knee 03/09/2021   Patellofemoral pain syndrome of left knee 02/09/2021    Current Outpatient Medications  Medication Sig Dispense Refill   atorvastatin (LIPITOR) 20 MG tablet Take 1 tablet (20 mg total) by mouth daily. 90 tablet 3   Continuous Blood Gluc Receiver (FREESTYLE LIBRE READER) DEVI Use as directed to check blood glucose 1 each 6   Continuous Blood Gluc Sensor (FREESTYLE LIBRE 3 SENSOR) MISC Apply 1 Units topically as directed. Place 1 sensor on the skin every 14 days. Use to check glucose continuously 1 each 6   glimepiride (AMARYL) 2 MG tablet Take 1 tablet (2 mg total) by mouth daily before breakfast. 90 tablet 1   metFORMIN (GLUCOPHAGE) 1000 MG tablet Take 1 tablet (1,000 mg total) by mouth 2 (two) times daily with a meal. 180 tablet 1   tamsulosin (FLOMAX) 0.4 MG CAPS capsule TAKE 1 CAPSULE BY MOUTH EVERYDAY AT BEDTIME 90 capsule 1   No current facility-administered medications for this visit.    Allergies: Patient has no known allergies.  Past Medical History:  Diagnosis Date   Calcaneus fracture, right    Diabetes mellitus without complication ([place])    History of kidney stones    Kidney stone    Shingles     Past Surgical History:  Procedure Laterality Date   EXTRACORPOREAL SHOCK WAVE LITHOTRIPSY Left 07/28/2019   Procedure: EXTRACORPOREAL SHOCK WAVE LITHOTRIPSY (ESWL);  Surgeon: Lucas Mallow, MD;  Location: [facility];  Service: Urology;  Laterality: Left;   LITHOTRIPSY     ORIF CALCANEOUS FRACTURE Right 04/25/2015   Procedure: OPEN REDUCTION INTERNAL FIXATION (ORIF) RIGHT CALCANEOUS FRACTURE;  Surgeon: Newt Minion, MD;  Location: [facility];  Service: Orthopedics;  Laterality: Right;   URETEROSCOPY      Family History  Problem Relation Age of Onset   Hypertension Mother    Heart attack Brother     Stomach cancer Other    Rectal cancer Other    Esophageal cancer Other    Colon polyps Other    Colon cancer Other     Social History   Tobacco Use   Smoking status: Never   Smokeless tobacco: Never  Substance Use Topics   Alcohol use: No    Subjective:  Follow up on Type 2 Diabetes; is trying to work on better food choices; does feel that his sugars may be going too low on him- has episodes where he "feels weak" and then does better after eating; Denies any chest pain, shortness of breath, blurred vision or headache.    Objective:  Vitals:   04/25/22 1526  BP: 110/70  Pulse: 66  Resp: 20  SpO2: 96%  Weight: 215 lb (97.5 kg)  Height: 6' 4"  (1.93 m)    General: Well developed, well nourished, in no acute distress  Skin : Warm and dry.  Head: Normocephalic and atraumatic  Lungs: Respirations unlabored; clear to auscultation bilaterally without wheeze, rales, rhonchi  CVS exam: normal rate and regular rhythm.  Neurologic: Alert and oriented; speech intact; face symmetrical; moves all extremities well; CNII-XII intact without focal deficit   Assessment:  1. Type 2 diabetes mellitus without complication, without long-term current use of insulin ([place])     Plan:  Update labs today; hold Glimepiride 2 mg; continue Metformin; to consider adding  Januvia or Actos if need 2nd agent; follow up to be determined.   No follow-ups on file.  Orders Placed This Encounter  Procedures   CBC with Differential/Platelet   Comp Met (CMET)   Hemoglobin A1c    Requested Prescriptions    No prescriptions requested or ordered in this encounter

## 2022-04-26 ENCOUNTER — Other Ambulatory Visit: Payer: Self-pay | Admitting: Family

## 2022-04-26 DIAGNOSIS — E119 Type 2 diabetes mellitus without complications: Secondary | ICD-10-CM

## 2022-04-26 LAB — CBC WITH DIFFERENTIAL/PLATELET
Basophils Absolute: 0 10*3/uL (ref 0.0–0.1)
Basophils Relative: 0.6 % (ref 0.0–3.0)
Eosinophils Absolute: 0.1 10*3/uL (ref 0.0–0.7)
Eosinophils Relative: 1.1 % (ref 0.0–5.0)
HCT: 40 % (ref 39.0–52.0)
Hemoglobin: 13.1 g/dL (ref 13.0–17.0)
Lymphocytes Relative: 36.1 % (ref 12.0–46.0)
Lymphs Abs: 2 10*3/uL (ref 0.7–4.0)
MCHC: 32.7 g/dL (ref 30.0–36.0)
MCV: 92 fl (ref 78.0–100.0)
Monocytes Absolute: 0.4 10*3/uL (ref 0.1–1.0)
Monocytes Relative: 7.9 % (ref 3.0–12.0)
Neutro Abs: 3 10*3/uL (ref 1.4–7.7)
Neutrophils Relative %: 54.3 % (ref 43.0–77.0)
Platelets: 152 10*3/uL (ref 150.0–400.0)
RBC: 4.35 Mil/uL (ref 4.22–5.81)
RDW: 13.1 % (ref 11.5–15.5)
WBC: 5.5 10*3/uL (ref 4.0–10.5)

## 2022-04-26 LAB — COMPREHENSIVE METABOLIC PANEL
ALT: 15 U/L (ref 0–53)
AST: 13 U/L (ref 0–37)
Albumin: 4.7 g/dL (ref 3.5–5.2)
Alkaline Phosphatase: 125 U/L — ABNORMAL HIGH (ref 39–117)
BUN: 17 mg/dL (ref 6–23)
CO2: 29 mEq/L (ref 19–32)
Calcium: 10 mg/dL (ref 8.4–10.5)
Chloride: 100 mEq/L (ref 96–112)
Creatinine, Ser: 1.18 mg/dL (ref 0.40–1.50)
GFR: 65.52 mL/min (ref >60.00)
Glucose, Bld: 184 mg/dL — ABNORMAL HIGH (ref 70–99)
Potassium: 4.3 mEq/L (ref 3.5–5.1)
Sodium: 137 mEq/L (ref 135–145)
Total Bilirubin: 1.8 mg/dL — ABNORMAL HIGH (ref 0.2–1.2)
Total Protein: 7.1 g/dL (ref 6.0–8.3)

## 2022-04-26 LAB — HEMOGLOBIN A1C: Hgb A1c MFr Bld: 9.7 % — ABNORMAL HIGH (ref 4.6–6.5)

## 2022-05-04 ENCOUNTER — Other Ambulatory Visit: Payer: Self-pay | Admitting: Family

## 2022-05-04 ENCOUNTER — Other Ambulatory Visit: Payer: Self-pay

## 2022-05-04 ENCOUNTER — Telehealth: Payer: Self-pay | Admitting: Family

## 2022-05-04 DIAGNOSIS — Z87438 Personal history of other diseases of male genital organs: Secondary | ICD-10-CM

## 2022-05-04 MED ORDER — TAMSULOSIN HCL 0.4 MG PO CAPS: ORAL_CAPSULE | ORAL | 1 refills | Status: DC

## 2022-05-04 NOTE — Telephone Encounter (Signed)
Medication: tamsulosin (FLOMAX) 0.4 MG CAPS capsule [[id number]]    Preferred Pharmacy (with phone number or street name): [id number] [id number] Phone:  [phone] Fax:  [phone] DEA #:  [id number] Agent: Please be advised that RX refills may take up to 3 business days. We ask that you follow-up with your pharmacy.

## 2022-05-04 NOTE — Telephone Encounter (Signed)
Rx refilled.

## 2022-07-03 IMAGING — CT CT HEAD W/O CM
3 series · 14 of 47 positions shown, 16 images · non-contrast
Comparison: None Available.

CLINICAL DATA: Headache.



[Series 2: head wo · axial · 0.44mm/px · z∈[+726,+851]mm · 8 of 30 slices shown, 10 images]
[im 3/30  brain]
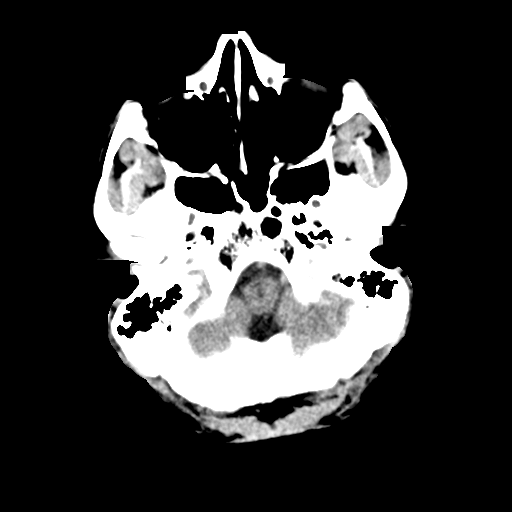
[im 3/30  bone]
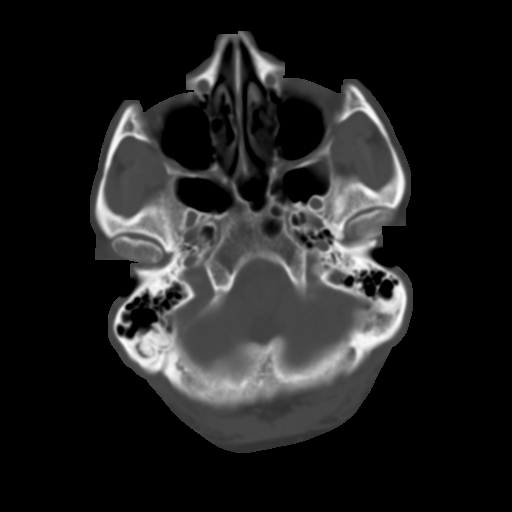
[im 7/30  brain]
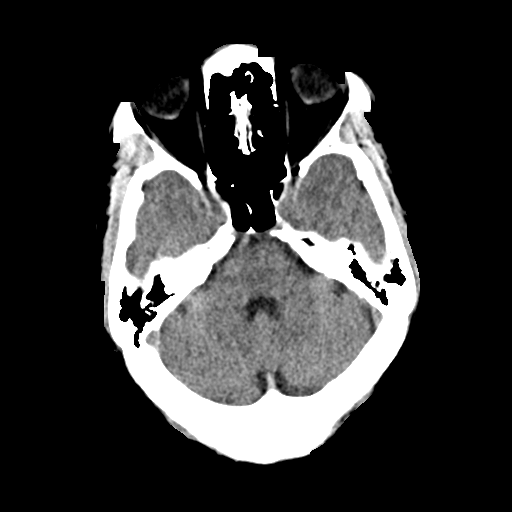
[im 10/30  brain]
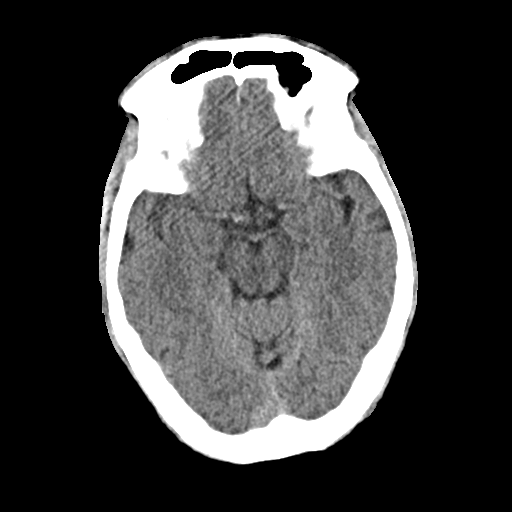
[im 14/30  brain]
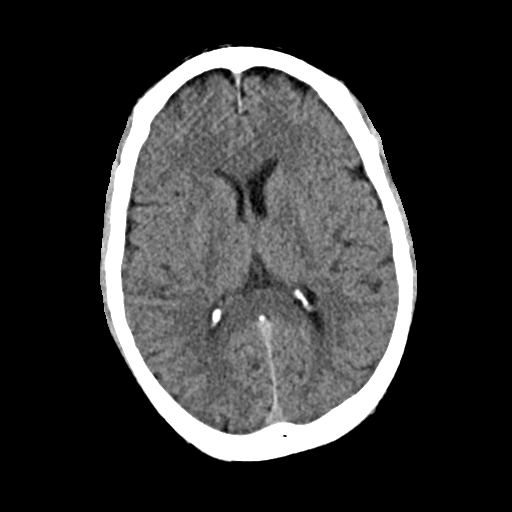
[im 17/30  brain]
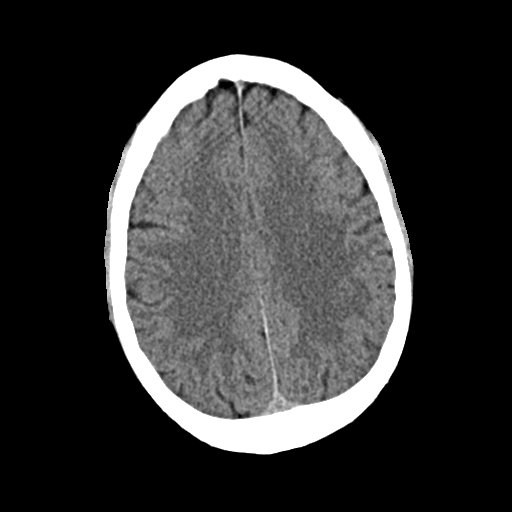
[im 17/30  bone]
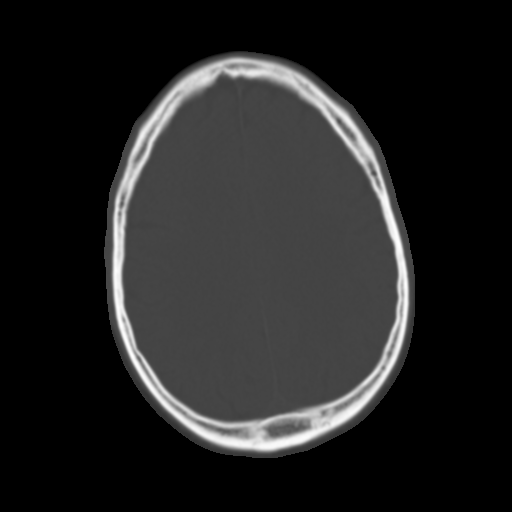
[im 21/30  brain]
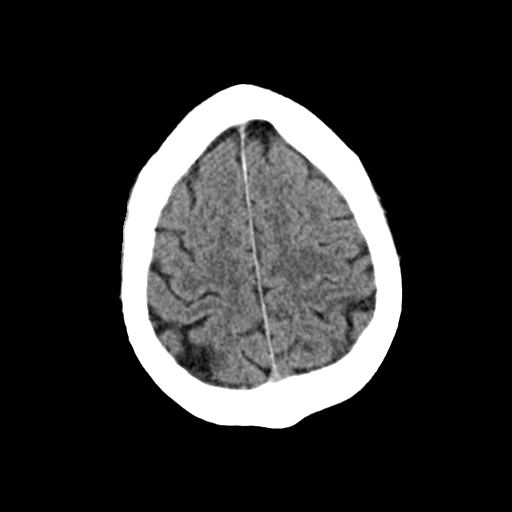
[im 24/30  brain]
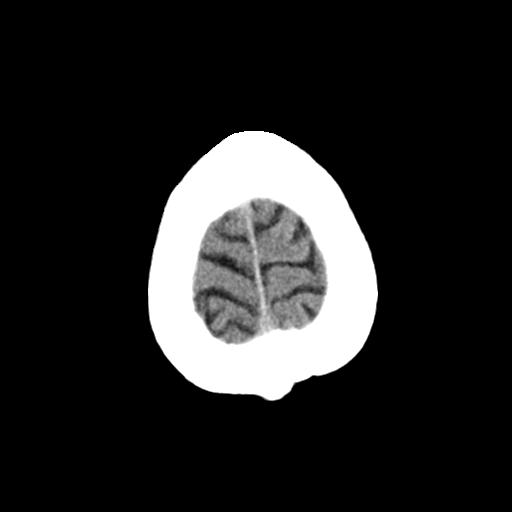
[im 28/30  brain]
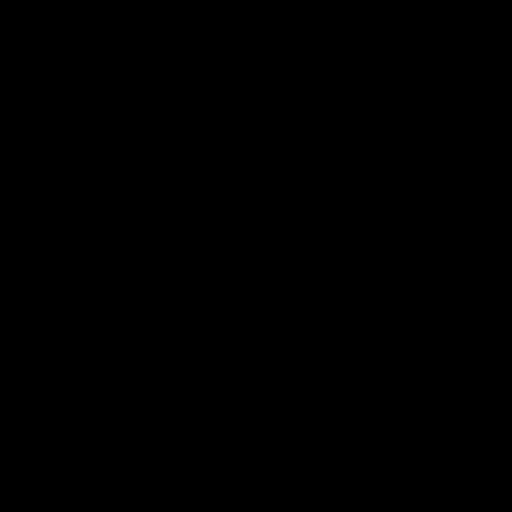

[Series 4: coronal soft · coronal · 0.29mm/px · 3 of 73 slices shown]
[im 25/73  brain]
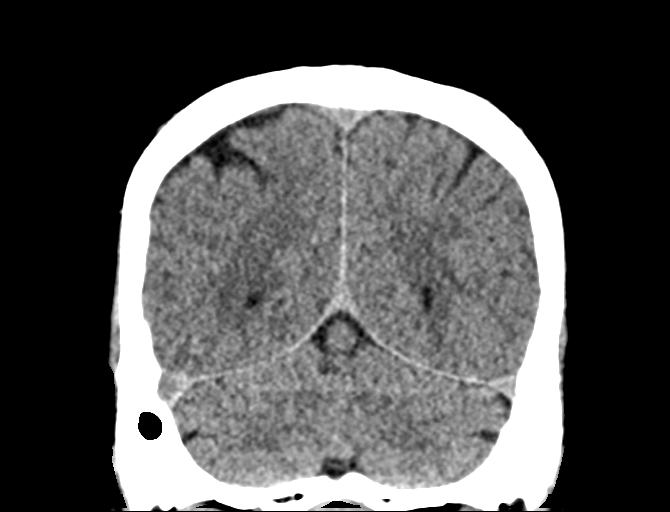
[im 33/73  brain]
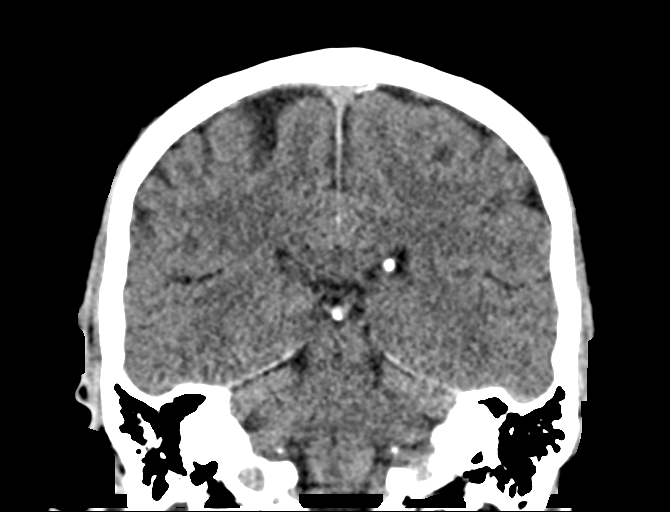
[im 41/73  brain]
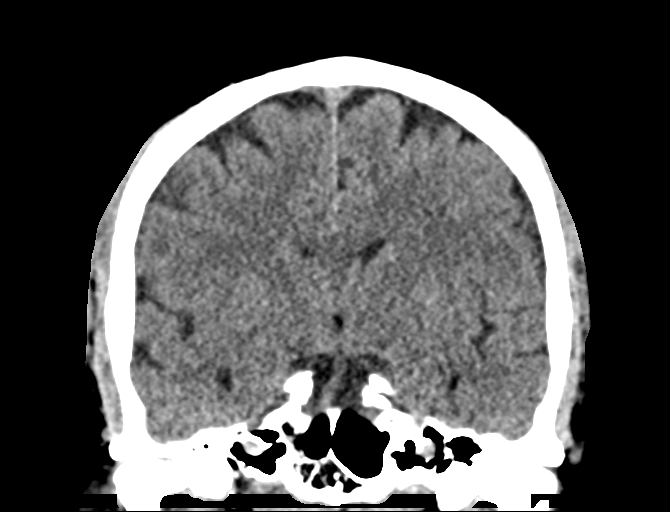

[Series 5: sag soft · sagittal · 0.29mm/px · 3 of 67 slices shown]
[im 23/67  brain]
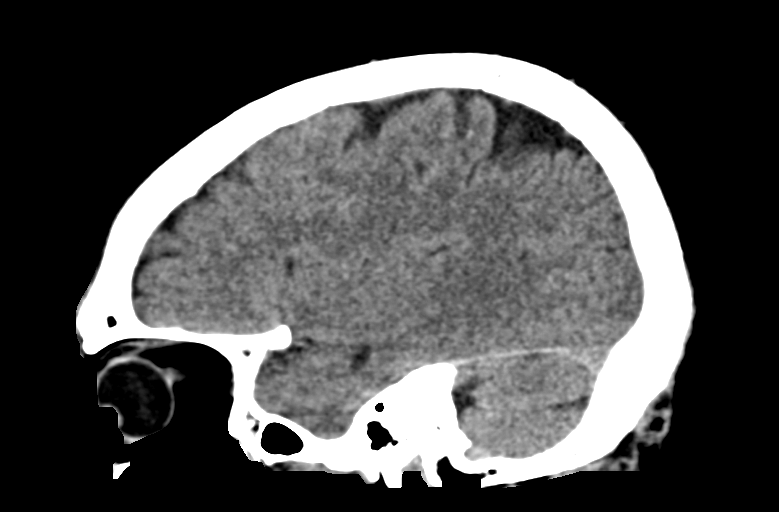
[im 34/67  brain]
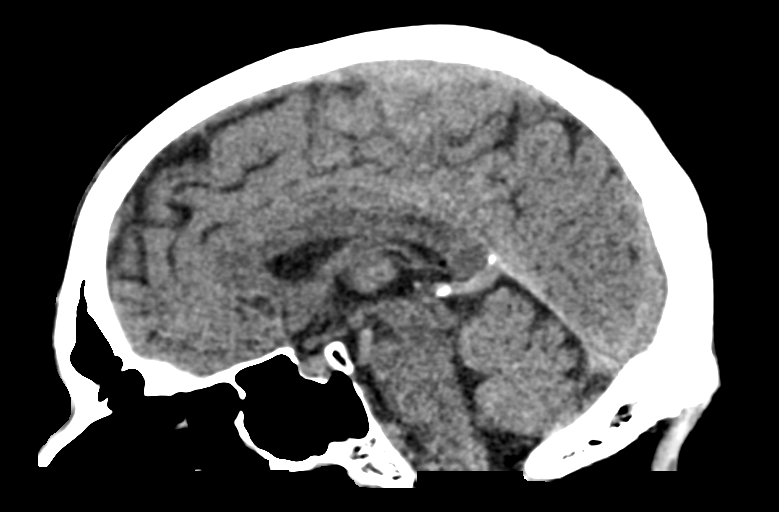
[im 45/67  brain]
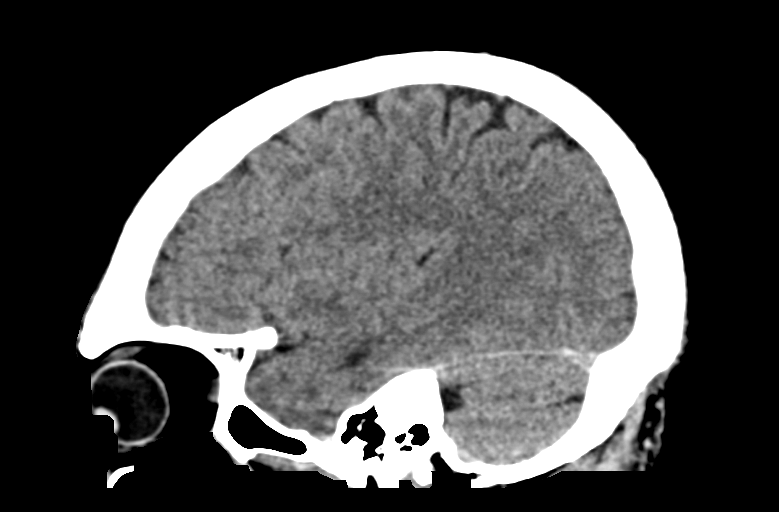

[14 of 47 positions shown; findings below may reference images not displayed]

FINDINGS: Brain: The ventricles and sulci are appropriate size for the
patient's age. The gray-white matter discrimination is preserved.
There is no acute intracranial hemorrhage. No mass effect or midline
shift. No extra-axial fluid collection.

Vascular: No hyperdense vessel or unexpected calcification.

Skull: Normal. Negative for fracture or focal lesion.

Sinuses/Orbits: No acute finding.

Other: None
IMPRESSION: No acute intracranial pathology.

## 2022-09-18 ENCOUNTER — Other Ambulatory Visit: Payer: Self-pay

## 2022-09-18 ENCOUNTER — Emergency Department (HOSPITAL_COMMUNITY): Payer: No Typology Code available for payment source

## 2022-09-18 ENCOUNTER — Emergency Department (HOSPITAL_COMMUNITY)
Admission: EM | Admit: 2022-09-18 | Discharge: 2022-09-18 | Disposition: A | Discharge status: Home / Self Care | Payer: No Typology Code available for payment source | Attending: Emergency Medicine | Admitting: Emergency Medicine

## 2022-09-18 ENCOUNTER — Encounter (HOSPITAL_COMMUNITY): Payer: Self-pay

## 2022-09-18 DIAGNOSIS — Z79899 Other long term (current) drug therapy: Secondary | ICD-10-CM | POA: Diagnosis not present

## 2022-09-18 DIAGNOSIS — E119 Type 2 diabetes mellitus without complications: Secondary | ICD-10-CM | POA: Insufficient documentation

## 2022-09-18 DIAGNOSIS — Z7984 Long term (current) use of oral hypoglycemic drugs: Secondary | ICD-10-CM | POA: Insufficient documentation

## 2022-09-18 DIAGNOSIS — U071 COVID-19: Secondary | ICD-10-CM | POA: Diagnosis not present

## 2022-09-18 DIAGNOSIS — R0602 Shortness of breath: Secondary | ICD-10-CM | POA: Diagnosis present

## 2022-09-18 LAB — BASIC METABOLIC PANEL
Anion gap: 8 (ref 5–15)
BUN: 19 mg/dL (ref 8–23)
CO2: 25 mmol/L (ref 22–32)
Calcium: 8.9 mg/dL (ref 8.9–10.3)
Chloride: 105 mmol/L (ref 98–111)
Creatinine, Ser: 1.15 mg/dL (ref 0.61–1.24)
GFR, Estimated: >60 mL/min (ref >60)
Glucose, Bld: 180 mg/dL — ABNORMAL HIGH (ref 70–99)
Potassium: 4 mmol/L (ref 3.5–5.1)
Sodium: 138 mmol/L (ref 135–145)

## 2022-09-18 LAB — RESP PANEL BY RT-PCR (RSV, FLU A&B, COVID)  RVPGX2
Influenza A by PCR: NEGATIVE
Influenza B by PCR: NEGATIVE
Resp Syncytial Virus by PCR: NEGATIVE
SARS Coronavirus 2 by RT PCR: POSITIVE — AB

## 2022-09-18 LAB — CBC
HCT: 40.4 % (ref 39.0–52.0)
Hemoglobin: 13.2 g/dL (ref 13.0–17.0)
MCH: 30.2 pg (ref 26.0–34.0)
MCHC: 32.7 g/dL (ref 30.0–36.0)
MCV: 92.4 fL (ref 80.0–100.0)
Platelets: 110 10*3/uL — ABNORMAL LOW (ref 150–400)
RBC: 4.37 MIL/uL (ref 4.22–5.81)
RDW: 12.1 % (ref 11.5–15.5)
WBC: 4.1 10*3/uL (ref 4.0–10.5)
nRBC: 0 % (ref 0.0–0.2)

## 2022-09-18 LAB — CBG MONITORING, ED: Glucose-Capillary: 204 mg/dL — ABNORMAL HIGH (ref 70–99)

## 2022-09-18 LAB — TROPONIN I (HIGH SENSITIVITY): Troponin I (High Sensitivity): 6 ng/L (ref <18)

## 2022-09-18 NOTE — ED Provider Notes (Signed)
[facility] DEPT Provider Note   CSN: [id number] Arrival date & time: 09/18/22  1017     History  Chief Complaint  Patient presents with   Shortness of Breath   Weakness   Cough   Chest Pain    Stephen Gill is a 66 y.o. male.   Shortness of Breath Associated symptoms: chest pain and cough   Weakness Associated symptoms: chest pain, cough and shortness of breath   Cough Associated symptoms: chest pain and shortness of breath   Chest Pain Associated symptoms: cough, shortness of breath and weakness   Patient presents with shortness of breath and cough.  Has had since Tuesday with today being Monday.  Shortness of breath.  Had some hemoptysis with coughing this morning.  Had worsening shortness of breath this morning but was resolved.  No known sick contacts.  Patient is diabetic.    Past Medical History:  Diagnosis Date   Calcaneus fracture, right    Diabetes mellitus without complication ([place])    History of kidney stones    Kidney stone    Shingles     Home Medications Prior to Admission medications   Medication Sig Start Date End Date Taking? Authorizing Provider  atorvastatin (LIPITOR) 20 MG tablet Take 1 tablet (20 mg total) by mouth daily. 06/13/21   Marrian Salvage, FNP  Continuous Blood Gluc Receiver (FREESTYLE LIBRE READER) DEVI Use as directed to check blood glucose 09/16/21   Marrian Salvage, FNP  Continuous Blood Gluc Sensor (FREESTYLE LIBRE 3 SENSOR) MISC Apply 1 Units topically as directed. Place 1 sensor on the skin every 14 days. Use to check glucose continuously 12/16/21   Marrian Salvage, FNP  fluconazole (DIFLUCAN) 100 MG tablet Take 1 tablet (100 mg total) by mouth daily. 04/25/22   Marrian Salvage, FNP  metFORMIN (GLUCOPHAGE) 1000 MG tablet TAKE 1 TABLET (1,000 MG TOTAL) BY MOUTH TWICE A DAY WITH FOOD 05/04/22   Marrian Salvage, FNP  tamsulosin (FLOMAX) 0.4 MG CAPS capsule TAKE 1 CAPSULE BY  MOUTH EVERYDAY AT BEDTIME 05/04/22   Marrian Salvage, FNP      Allergies    Patient has no known allergies.    Review of Systems   Review of Systems  Respiratory:  Positive for cough and shortness of breath.   Cardiovascular:  Positive for chest pain.  Neurological:  Positive for weakness.    Physical Exam Updated Vital Signs BP 113/73 (BP Location: Left Arm)   Pulse 72   Temp 98.4 F (36.9 C) (Oral)   Resp 16   Ht '6\' 4"'$  (1.93 m)   Wt 102.1 kg   SpO2 99%   BMI 27.39 kg/m  Physical Exam Vitals and nursing note reviewed.  Cardiovascular:     Rate and Rhythm: Normal rate.  Pulmonary:     Comments: Mildly harsh breath sounds diffusely.  No respiratory distress. Chest:     Chest wall: No tenderness.  Abdominal:     Tenderness: There is no abdominal tenderness.  Skin:    General: Skin is warm.  Neurological:     Mental Status: He is alert.     ED Results / Procedures / Treatments   Labs (all labs ordered are listed, but only abnormal results are displayed) Labs Reviewed  RESP PANEL BY RT-PCR (RSV, FLU A&B, COVID)  [id number] - Abnormal; Notable for the following components:      Result Value   SARS Coronavirus 2 by RT PCR  POSITIVE (*)    All other components within normal limits  BASIC METABOLIC PANEL - Abnormal; Notable for the following components:   Glucose, Bld 180 (*)    All other components within normal limits  CBC - Abnormal; Notable for the following components:   Platelets 110 (*)    All other components within normal limits  CBG MONITORING, ED - Abnormal; Notable for the following components:   Glucose-Capillary 204 (*)    All other components within normal limits  TROPONIN I (HIGH SENSITIVITY)    EKG None  Radiology DG Chest 2 View  Result Date: 09/18/2022 CLINICAL DATA:  chest pain EXAM: CHEST - 2 VIEW COMPARISON:  None Available. FINDINGS: The cardiomediastinal silhouette is normal in contour. No pleural effusion. No pneumothorax. No  acute pleuroparenchymal abnormality. Visualized abdomen is unremarkable. No acute osseous abnormality noted. IMPRESSION: No acute cardiopulmonary abnormality. Electronically Signed   By: Valentino Saxon M.D.   On: 09/18/2022 08:48    Procedures Procedures    Medications Ordered in ED Medications - No data to display  ED Course/ Medical Decision Making/ A&P                           Medical Decision Making Amount and/or Complexity of Data Reviewed Labs: ordered. Radiology: ordered.   Patient with URI symptoms cough.  Slight amount of process.  Chest x-ray reviewed and reassuring.  Differential diagnosis includes pneumonia URI, other infection.  Pulm embolism felt less likely.  However does have positive COVID testing.  Will get basic blood work and ambulate throughout the room to watch for desaturation.  Not a candidate for Paxlovid since symptoms have been there for 7 days.  COVID test is positive.  X-ray reassuring.  No pneumonia.  Not hypoxic.  Appears stable for discharge home.  Doubt cardiac ischemia.  Discussed with patient and his wife.  Symptomatic treatment and outpatient follow-up as needed.        Final Clinical Impression(s) / ED Diagnoses Final diagnoses:  [id number]    Rx / DC Orders ED Discharge Orders     None         Davonna Belling, MD 09/18/22 1249

## 2022-09-18 NOTE — ED Triage Notes (Addendum)
Patient's wife reports that the patient has been having a "bad cold " x 6 days. Today, the patient c/o SOB, chest pain, and weakness. Patient also reports that he had a "speckled blood " when he coughed this AM.

## 2022-09-18 NOTE — ED Provider Triage Note (Signed)
Emergency Medicine Provider Triage Evaluation Note  Stephen Gill , a 66 y.o. male  was evaluated in triage.  Pt complains of shortness of breath, chest pain, cough, diffuse myalgias/arthralgias.  Patient states his symptoms began this past Tuesday.  Noted worsening shortness of breath as well as cough.  Episode of streaky hemoptysis occurring this morning.  Denies fever, chills, abdominal pain, nausea, vomiting.  Review of Systems  Positive: See above Negative:   Physical Exam  BP 120/85 (BP Location: Left Arm)   Pulse 83   Temp 98.4 F (36.9 C) (Oral)   Resp 18   Ht '6\' 4"'$  (1.93 m)   Wt 102.1 kg   SpO2 97%   BMI 27.39 kg/m  Gen:   Awake, no distress   Resp:  Normal effort  MSK:   Moves extremities without difficulty  Other:    Medical Decision Making  Medically screening exam initiated at 8:58 AM.  Appropriate orders placed.  [facility] was informed that the remainder of the evaluation will be completed by another provider, this initial triage assessment does not replace that evaluation, and the importance of remaining in the ED until their evaluation is complete.     Wilnette Kales, [place] 09/18/22 [phone]

## 2022-09-19 ENCOUNTER — Telehealth: Payer: Self-pay

## 2022-09-19 NOTE — Telephone Encounter (Signed)
Transition Care Management Follow-up Telephone Call Date of discharge and from where: 09/18/2022 from [facility] How have you been since you were released from the hospital? Patient states that he is feeling "rough but okay considering". Patient stated that he is taking the OTC cough medication recommended by the ED. Patient did not have any other concerns at this time.  Any questions or concerns? No  Items Reviewed: Did the pt receive and understand the discharge instructions provided? Yes  Medications obtained and verified? Yes  Other? No  Any new allergies since your discharge? No  Dietary orders reviewed? No Do you have support at home? Yes   Functional Questionnaire: (I = Independent and D = Dependent) ADLs: I Bathing/Dressing- I Meal Prep- I Eating- I Maintaining continence- I Transferring/Ambulation- I Managing Meds- I  Follow up appointments reviewed: PCP Hospital f/u appt confirmed? No  Specialist Hospital f/u appt confirmed? No  Are transportation arrangements needed? No  If their condition worsens, is the pt aware to call PCP or go to the Emergency Dept.? Yes Was the patient provided with contact information for the PCP's office or ED? Yes Was to pt encouraged to call back with questions or concerns? Yes

## 2022-09-23 ENCOUNTER — Ambulatory Visit
Admission: EM | Admit: 2022-09-23 | Discharge: 2022-09-23 | Disposition: A | Discharge status: Home / Self Care | Payer: No Typology Code available for payment source | Attending: Emergency Medicine | Admitting: Emergency Medicine

## 2022-09-23 DIAGNOSIS — U099 Post covid-19 condition, unspecified: Secondary | ICD-10-CM

## 2022-09-23 DIAGNOSIS — U071 COVID-19: Secondary | ICD-10-CM | POA: Diagnosis not present

## 2022-09-23 DIAGNOSIS — R058 Other specified cough: Secondary | ICD-10-CM

## 2022-09-23 MED ORDER — AEROCHAMBER PLUS FLO-VU LARGE MISC: 1.0000 | Freq: Once | 0 refills | Status: AC

## 2022-09-23 MED ORDER — PROMETHAZINE-DM 6.25-15 MG/5ML PO SYRP: 5.0000 mL | ORAL_SOLUTION | Freq: Every evening | ORAL | 0 refills | Status: DC | PRN

## 2022-09-23 MED ORDER — ALBUTEROL SULFATE HFA 108 (90 BASE) MCG/ACT IN AERS: 2.0000 | INHALATION_SPRAY | Freq: Four times a day (QID) | RESPIRATORY_TRACT | 1 refills | Status: DC | PRN

## 2022-09-23 MED ORDER — DEXAMETHASONE 6 MG PO TABS: 6.0000 mg | ORAL_TABLET | Freq: Two times a day (BID) | ORAL | 0 refills | Status: AC

## 2022-09-23 MED ORDER — DEXAMETHASONE SODIUM PHOSPHATE 10 MG/ML IJ SOLN
10.0000 mg | Freq: Once | INTRAMUSCULAR | Status: AC
  Administered 2022-09-23: 10 mg via INTRAMUSCULAR

## 2022-09-23 NOTE — ED Provider Notes (Signed)
[facility]    CSN: [id number] Arrival date & time: 09/23/22  1427    HISTORY   Chief Complaint  Patient presents with   Covid Positive   HPI Stephen Gill is a pleasant, 65 y.o. male who presents to urgent care today. Patient states he was diagnosed with COVID-19 at the emergency room 5 days ago.  Patient states symptoms began about a week prior to going to the emergency room.  Patient states at this time his cough is his biggest concern, sometimes he coughs so hard he feels like he is going to throw up and become short of breath.  Patient denies history of allergies and asthma.  Patient states also experiencing nasal congestion and runny nose.  Patient denies fever, body aches, chills, nausea, vomiting, diarrhea.  Patient has a slightly diminished O2 saturation on arrival today.  The history is provided by the patient.   Past Medical History:  Diagnosis Date   Calcaneus fracture, right    Diabetes mellitus without complication (HCC)    History of kidney stones    Kidney stone    Shingles    Patient Active Problem List   Diagnosis Date Noted   Primary osteoarthritis of left knee 03/09/2021   Patellofemoral pain syndrome of left knee 02/09/2021   Past Surgical History:  Procedure Laterality Date   EXTRACORPOREAL SHOCK WAVE LITHOTRIPSY Left 07/28/2019   Procedure: EXTRACORPOREAL SHOCK WAVE LITHOTRIPSY (ESWL);  Surgeon: Crista Elliot, MD;  Location: [facility];  Service: Urology;  Laterality: Left;   LITHOTRIPSY     ORIF CALCANEOUS FRACTURE Right 04/25/2015   Procedure: OPEN REDUCTION INTERNAL FIXATION (ORIF) RIGHT CALCANEOUS FRACTURE;  Surgeon: Nadara Mustard, MD;  Location: [facility];  Service: Orthopedics;  Laterality: Right;   URETEROSCOPY      Home Medications    Prior to Admission medications   Medication Sig Start Date End Date Taking? Authorizing Provider  albuterol (VENTOLIN HFA) 108 (90 Base) MCG/ACT inhaler Inhale 2 puffs into the lungs every 6 (six)  hours as needed for wheezing or shortness of breath (Cough). 09/23/22  Yes Theadora Rama Scales, PA-C  dexamethasone (DECADRON) 6 MG tablet Take 1 tablet (6 mg total) by mouth 2 (two) times daily with a meal for 5 days. 09/23/22 09/28/22 Yes Theadora Rama Scales, PA-C  promethazine-dextromethorphan (PROMETHAZINE-DM) 6.25-15 MG/5ML syrup Take 5 mLs by mouth at bedtime as needed for cough. 09/23/22  Yes Theadora Rama Scales, PA-C  Spacer/Aero-Holding Chambers (AEROCHAMBER PLUS FLO-VU LARGE) MISC 1 each by Other route once for 1 dose. 09/23/22 09/23/22 Yes Theadora Rama Scales, PA-C  atorvastatin (LIPITOR) 20 MG tablet Take 1 tablet (20 mg total) by mouth daily. 06/13/21   Olive Bass, FNP  Continuous Blood Gluc Receiver (FREESTYLE LIBRE READER) DEVI Use as directed to check blood glucose 09/16/21   Olive Bass, FNP  Continuous Blood Gluc Sensor (FREESTYLE LIBRE 3 SENSOR) MISC Apply 1 Units topically as directed. Place 1 sensor on the skin every 14 days. Use to check glucose continuously 12/16/21   Olive Bass, FNP  fluconazole (DIFLUCAN) 100 MG tablet Take 1 tablet (100 mg total) by mouth daily. 04/25/22   Olive Bass, FNP  metFORMIN (GLUCOPHAGE) 1000 MG tablet TAKE 1 TABLET (1,000 MG TOTAL) BY MOUTH TWICE A DAY WITH FOOD 05/04/22   Olive Bass, FNP  tamsulosin (FLOMAX) 0.4 MG CAPS capsule TAKE 1 CAPSULE BY MOUTH EVERYDAY AT BEDTIME 05/04/22   Olive Bass, FNP  Family History Family History  Problem Relation Age of Onset   Hypertension Mother    Heart attack Brother    Stomach cancer Other    Rectal cancer Other    Esophageal cancer Other    Colon polyps Other    Colon cancer Other    Social History Social History   Tobacco Use   Smoking status: Never   Smokeless tobacco: Never  Vaping Use   Vaping Use: Never used  Substance Use Topics   Alcohol use: No   Drug use: No   Allergies   Patient has no known  allergies.  Review of Systems Review of Systems Pertinent findings revealed after performing a 14 point review of systems has been noted in the history of present illness.  Physical Exam Triage Vital Signs ED Triage Vitals  Enc Vitals Group     BP 07/22/21 0827 (!) 147/82     Pulse Rate 07/22/21 0827 72     Resp 07/22/21 0827 18     Temp 07/22/21 0827 98.3 F (36.8 C)     Temp Source 07/22/21 0827 Oral     SpO2 07/22/21 0827 98 %     Weight --      Height --      Head Circumference --      Peak Flow --      Pain Score 07/22/21 0826 5     Pain Loc --      Pain Edu? --      Excl. in GC? --   No data found.  Updated Vital Signs BP 106/81 (BP Location: Left Arm)   Pulse 94   Temp (!) 97.4 F (36.3 C) (Oral)   Resp 16   SpO2 95%   Physical Exam Vitals and nursing note reviewed.  Constitutional:      General: He is not in acute distress.    Appearance: Normal appearance. He is not ill-appearing.  HENT:     Head: Normocephalic and atraumatic.     Salivary Glands: Right salivary gland is not diffusely enlarged or tender. Left salivary gland is not diffusely enlarged or tender.     Right Ear: Tympanic membrane, ear canal and external ear normal. No drainage. No middle ear effusion. There is no impacted cerumen. Tympanic membrane is not erythematous or bulging.     Left Ear: Tympanic membrane, ear canal and external ear normal. No drainage.  No middle ear effusion. There is no impacted cerumen. Tympanic membrane is not erythematous or bulging.     Nose: Nose normal. No nasal deformity, septal deviation, mucosal edema, congestion or rhinorrhea.     Right Turbinates: Not enlarged, swollen or pale.     Left Turbinates: Not enlarged, swollen or pale.     Right Sinus: No maxillary sinus tenderness or frontal sinus tenderness.     Left Sinus: No maxillary sinus tenderness or frontal sinus tenderness.     Mouth/Throat:     Lips: Pink. No lesions.     Mouth: Mucous membranes are  moist. No oral lesions.     Pharynx: Oropharynx is clear. Uvula midline. No posterior oropharyngeal erythema or uvula swelling.     Tonsils: No tonsillar exudate. 0 on the right. 0 on the left.  Eyes:     General: Lids are normal.        Right eye: No discharge.        Left eye: No discharge.     Extraocular Movements: Extraocular movements intact.  Conjunctiva/sclera: Conjunctivae normal.     Right eye: Right conjunctiva is not injected.     Left eye: Left conjunctiva is not injected.  Neck:     Trachea: Trachea and phonation normal.  Cardiovascular:     Rate and Rhythm: Normal rate and regular rhythm.     Pulses: Normal pulses.     Heart sounds: Normal heart sounds. No murmur heard.    No friction rub. No gallop.  Pulmonary:     Effort: Pulmonary effort is normal. No tachypnea, bradypnea, accessory muscle usage, prolonged expiration, respiratory distress or retractions.     Breath sounds: No stridor, decreased air movement or transmitted upper airway sounds. Examination of the right-upper field reveals wheezing. Examination of the left-upper field reveals wheezing. Wheezing present. No decreased breath sounds, rhonchi or rales.     Comments: Severe bronchospasm with cough Chest:     Chest wall: No tenderness.  Musculoskeletal:        General: Normal range of motion.     Cervical back: Normal range of motion and neck supple. Normal range of motion.  Lymphadenopathy:     Cervical: No cervical adenopathy.  Skin:    General: Skin is warm and dry.     Findings: No erythema or rash.  Neurological:     General: No focal deficit present.     Mental Status: He is alert and oriented to person, place, and time.  Psychiatric:        Mood and Affect: Mood normal.        Behavior: Behavior normal.     Visual Acuity Right Eye Distance:   Left Eye Distance:   Bilateral Distance:    Right Eye Near:   Left Eye Near:    Bilateral Near:     UC Couse / Diagnostics / Procedures:      Radiology No results found.  Procedures Procedures (including critical care time) EKG  Pending results:  Labs Reviewed - No data to display  Medications Ordered in UC: Medications  dexamethasone (DECADRON) injection 10 mg (has no administration in time range)    UC Diagnoses / Final Clinical Impressions(s)   I have reviewed the triage vital signs and the nursing notes.  Pertinent labs & imaging results that were available during my care of the patient were reviewed by me and considered in my medical decision making (see chart for details).    Final diagnoses:  COVID-19  Post-COVID syndrome  Post-viral cough syndrome   Patient provided with an injection of Decadron during his visit today and advised to continue taking Decadron 6 mg tablets twice daily for the next 5 days to reduce systemic inflammation caused by COVID-19 infection.  Patient provided with Promethazine DM for nighttime cough and an albuterol inhaler to help improve his work of breathing.  Return precautions advised. Please see discharge instructions below for further details of plan of care as provided to patient. ED Prescriptions     Medication Sig Dispense Auth. Provider   dexamethasone (DECADRON) 6 MG tablet Take 1 tablet (6 mg total) by mouth 2 (two) times daily with a meal for 5 days. 10 tablet Theadora Rama Scales, PA-C   promethazine-dextromethorphan (PROMETHAZINE-DM) 6.25-15 MG/5ML syrup Take 5 mLs by mouth at bedtime as needed for cough. 60 mL Theadora Rama Scales, PA-C   albuterol (VENTOLIN HFA) 108 (90 Base) MCG/ACT inhaler Inhale 2 puffs into the lungs every 6 (six) hours as needed for wheezing or shortness of breath (Cough). 54 g Lequita Halt,  Lillia Abed Scales, PA-C   Spacer/Aero-Holding Chambers (AEROCHAMBER PLUS FLO-VU LARGE) MISC 1 each by Other route once for 1 dose. 1 each Theadora Rama Scales, PA-C      PDMP not reviewed this encounter.  Disposition Upon Discharge:  Condition: stable for  discharge home Home: take medications as prescribed; routine discharge instructions as discussed; follow up as advised.  Patient presented with an acute illness with associated systemic symptoms and significant discomfort requiring urgent management. In my opinion, this is a condition that a prudent lay person (someone who possesses an average knowledge of health and medicine) may potentially expect to result in complications if not addressed urgently such as respiratory distress, impairment of bodily function or dysfunction of bodily organs.   Routine symptom specific, illness specific and/or disease specific instructions were discussed with the patient and/or caregiver at length.   As such, the patient has been evaluated and assessed, work-up was performed and treatment was provided in alignment with urgent care protocols and evidence based medicine.  Patient/parent/caregiver has been advised that the patient may require follow up for further testing and treatment if the symptoms continue in spite of treatment, as clinically indicated and appropriate.  If the patient was tested for COVID-19, Influenza and/or RSV, then the patient/parent/guardian was advised to isolate at home pending the results of his/her diagnostic coronavirus test and potentially longer if they're positive. I have also advised pt that if his/her COVID-19 test returns positive, it's recommended to self-isolate for at least 10 days after symptoms first appeared AND until fever-free for 24 hours without fever reducer AND other symptoms have improved or resolved. Discussed self-isolation recommendations as well as instructions for household member/close contacts as per the [facility] and [place] DHHS, and also gave patient the COVID packet with this information.  Patient/parent/caregiver has been advised to return to the [facility] or PCP in 3-5 days if no better; to PCP or the Emergency Department if new signs and symptoms develop, or if the current signs  or symptoms continue to change or worsen for further workup, evaluation and treatment as clinically indicated and appropriate  The patient will follow up with their current PCP if and as advised. If the patient does not currently have a PCP we will assist them in obtaining one.   The patient may need specialty follow up if the symptoms continue, in spite of conservative treatment and management, for further workup, evaluation, consultation and treatment as clinically indicated and appropriate.  Patient/parent/caregiver verbalized understanding and agreement of plan as discussed.  All questions were addressed during visit.  Please see discharge instructions below for further details of plan.  Discharge Instructions:   Discharge Instructions      Please read below to learn more about the medications, dosages and frequencies that I recommend to help alleviate your symptoms and to get you feeling better soon:   Decadron IM (dexamethasone):  To quickly address your significant respiratory inflammation, you were provided with an injection of Decadron in the office today.  You should continue to feel the full benefit of the steroid for the next 12-24 hours.    Decadron (dexamethasone): This is a steroid that will significantly calm the inflammation caused by COVID-19.  Please take 1 tablet twice daily for the next 5 days.   ProAir, Ventolin, Proventil (albuterol): This inhaled medication contains a short acting beta agonist bronchodilator.  This medication works on the smooth muscle that opens and constricts of your airways by relaxing the muscle.  The result of  relaxation of the smooth muscle is increased air movement and improved work of breathing.  This is a short acting medication that can be used every 4-6 hours as needed for increased work of breathing, shortness of breath, wheezing and excessive coughing.  For the next week, please inhale 2 puffs 4 times daily and again every time you have an  intense episode of coughing or feel short of breath.  After 1 week, decrease to 2 puffs twice daily and again every time you have an episode of intense coughing or feel short of breath.  After 2 weeks, inhale 2 puffs every time you have an intense episode of coughing or feel short of breath.  Promethazine DM: Promethazine is both a nasal decongestant and an antinausea medication that makes most patients feel fairly sleepy.  The DM is dextromethorphan, a cough suppressant found in many over-the-counter cough medications.  Please take 5 mL before bedtime to minimize your cough which will help you sleep better.  You can take a second dose after 4 hours, if needed.     Robitussin, Mucinex (guaifenesin): This is an expectorant.  I would like for you to take this medication in the daytime.  This helps break up chest congestion and loosen up thick nasal drainage making phlegm and drainage more liquid and therefore easier to remove.  I recommend being 400 mg three times daily as needed.      Please follow-up within the next 5-7 days either with your primary care provider or urgent care if your symptoms do not resolve.  If you do not have a primary care provider, we will assist you in finding one.        Thank you for visiting urgent care today.  We appreciate the opportunity to participate in your care.     This office note has been dictated using Teaching laboratory technician.  Unfortunately, this method of dictation can sometimes lead to typographical or grammatical errors.  I apologize for your inconvenience in advance if this occurs.  Please do not hesitate to reach out to me if clarification is needed.      Theadora Rama Scales, PA-C 09/24/22 1321

## 2022-09-23 NOTE — ED Triage Notes (Signed)
The patient was recently diagnosed with covid and was told to come here for Paxlovid. The patient is still having congestion.

## 2022-09-23 NOTE — Discharge Instructions (Signed)
Please read below to learn more about the medications, dosages and frequencies that I recommend to help alleviate your symptoms and to get you feeling better soon:   Decadron IM (dexamethasone):  To quickly address your significant respiratory inflammation, you were provided with an injection of Decadron in the office today.  You should continue to feel the full benefit of the steroid for the next 12-24 hours.    Decadron (dexamethasone): This is a steroid that will significantly calm the inflammation caused by COVID-19.  Please take 1 tablet twice daily for the next 5 days.   ProAir, Ventolin, Proventil (albuterol): This inhaled medication contains a short acting beta agonist bronchodilator.  This medication works on the smooth muscle that opens and constricts of your airways by relaxing the muscle.  The result of relaxation of the smooth muscle is increased air movement and improved work of breathing.  This is a short acting medication that can be used every 4-6 hours as needed for increased work of breathing, shortness of breath, wheezing and excessive coughing.  For the next week, please inhale 2 puffs 4 times daily and again every time you have an intense episode of coughing or feel short of breath.  After 1 week, decrease to 2 puffs twice daily and again every time you have an episode of intense coughing or feel short of breath.  After 2 weeks, inhale 2 puffs every time you have an intense episode of coughing or feel short of breath.  Promethazine DM: Promethazine is both a nasal decongestant and an antinausea medication that makes most patients feel fairly sleepy.  The DM is dextromethorphan, a cough suppressant found in many over-the-counter cough medications.  Please take 5 mL before bedtime to minimize your cough which will help you sleep better.  You can take a second dose after 4 hours, if needed.     Robitussin, Mucinex (guaifenesin): This is an expectorant.  I would like for you to take this  medication in the daytime.  This helps break up chest congestion and loosen up thick nasal drainage making phlegm and drainage more liquid and therefore easier to remove.  I recommend being 400 mg three times daily as needed.      Please follow-up within the next 5-7 days either with your primary care provider or urgent care if your symptoms do not resolve.  If you do not have a primary care provider, we will assist you in finding one.        Thank you for visiting urgent care today.  We appreciate the opportunity to participate in your care.

## 2022-09-26 ENCOUNTER — Telehealth: Payer: Self-pay

## 2022-09-26 NOTE — Telephone Encounter (Signed)
Patient evaluated/treated in ER/UC.   [facility] Night - Client TELEPHONE ADVICE RECORD AccessNurse Patient Name: Stephen Gill Chief Complaint BREATHING - shortness of breath or sounds breathless Reason for Call Symptomatic / Request for [place] stated that they are covid pos, is c/ o headache, cough, loss of taste, weakness, dizziness, SOB, wants paxlovid. Translation No Nurse Assessment Nurse: Hardin Negus, RN, Mardene Celeste Date/Time Eilene Ghazi Time): 09/23/2022 1:18:13 PM Confirm and document reason for call. If symptomatic, describe symptoms. ---He is covid + He has a headache, cough, loss of taste, weakness, dizziness, SOB. He has no fever. Does the patient have any new or worsening symptoms? ---Yes Will a triage be completed? ---Yes Related visit to physician within the last 2 weeks? ---No Does the PT have any chronic conditions? (i.e. diabetes, asthma, this includes High risk factors for pregnancy, etc.) ---Yes List chronic conditions. ---diabetic Is this a behavioral health or substance abuse call? ---No Nurse: Hardin Negus, RN, Mardene Celeste Date/Time Eilene Ghazi Time): 09/23/2022 1:21:20 PM Please select the assessment type ---Verbal order / New medication order Does the client directives allow for assistance with medications after hours? ---No Guidelines Guideline Title Affirmed Question Affirmed Notes Nurse Date/Time Eilene Ghazi Time) COVID-19 - Diagnosed or Suspected Chest pain or pressure (Exception: MILD central chest Oren Bracket 09/23/2022 1:19:29 PM PLEASE NOTE: All timestamps contained within this report are represented as [place] Standard Time. CONFIDENTIALTY NOTICE: This fax transmission is intended only for the addressee. It contains information that is legally privileged, confidential or otherwise protected from use or disclosure. If you are not the intended recipient, you are strictly prohibited from reviewing,  disclosing, copying using or disseminating any of this information or taking any action in reliance on or regarding this information. If you have received this fax in error, please notify us immediately by telephone so that we can arrange for its return to [place]. Phone: [phone], Toll-Free: [phone], Fax: [phone] Page: 2 of 2 Call Id: [id number] Guidelines Guideline Title Affirmed Question Affirmed Notes Nurse Date/Time Eilene Ghazi Time) pain, present only when coughing.) Disp. Time Eilene Ghazi Time) Disposition Final User 09/23/2022 1:16:38 PM Send to Urgent Tenny Craw, April 09/23/2022 1:22:30 PM Go to ED Now (or PCP triage) Yes Hardin Negus, RN, Mardene Celeste Final Disposition 09/23/2022 1:22:30 PM Go to ED Now (or PCP triage) Yes Hardin Negus, RN, [place] Disagree/Comply Comply Caller Understands Yes PreDisposition Call Doctor Care Advice Given Per Guideline GO TO ED NOW (OR PCP TRIAGE): * IF NO PCP (PRIMARY CARE PROVIDER) SECOND-LEVEL TRIAGE: You need to be seen within the next hour. Go to the [place] at _____________ [place] as soon as you can. CARE ADVICE given per COVID-19 - DIAGNOSED OR SUSPECTED (Adult) guideline. Referrals [facility] [facility]

## 2022-10-13 ENCOUNTER — Ambulatory Visit (INDEPENDENT_AMBULATORY_CARE_PROVIDER_SITE_OTHER): Payer: No Typology Code available for payment source | Admitting: Family

## 2022-10-13 VITALS — BP 122/62 | HR 88 | Resp 18 | Ht 76.0 in | Wt 212.4 lb

## 2022-10-13 DIAGNOSIS — R350 Frequency of micturition: Secondary | ICD-10-CM | POA: Diagnosis not present

## 2022-10-13 DIAGNOSIS — N410 Acute prostatitis: Secondary | ICD-10-CM

## 2022-10-13 DIAGNOSIS — E119 Type 2 diabetes mellitus without complications: Secondary | ICD-10-CM

## 2022-10-13 MED ORDER — DOXYCYCLINE HYCLATE 100 MG PO TABS: 100.0000 mg | ORAL_TABLET | Freq: Two times a day (BID) | ORAL | 0 refills | Status: DC

## 2022-10-13 NOTE — Progress Notes (Signed)
Stephen Gill is a 67 y.o. male with the following history as recorded in EpicCare:  Patient Active Problem List   Diagnosis Date Noted   Primary osteoarthritis of left knee 03/09/2021   Patellofemoral pain syndrome of left knee 02/09/2021    Current Outpatient Medications  Medication Sig Dispense Refill   albuterol (VENTOLIN HFA) 108 (90 Base) MCG/ACT inhaler Inhale 2 puffs into the lungs every 6 (six) hours as needed for wheezing or shortness of breath (Cough). 54 g 1   atorvastatin (LIPITOR) 20 MG tablet Take 1 tablet (20 mg total) by mouth daily. 90 tablet 3   Continuous Blood Gluc Receiver (FREESTYLE LIBRE READER) DEVI Use as directed to check blood glucose 1 each 6   Continuous Blood Gluc Sensor (FREESTYLE LIBRE 3 SENSOR) MISC Apply 1 Units topically as directed. Place 1 sensor on the skin every 14 days. Use to check glucose continuously 1 each 6   doxycycline (VIBRA-TABS) 100 MG tablet Take 1 tablet (100 mg total) by mouth 2 (two) times daily. 20 tablet 0   fluconazole (DIFLUCAN) 100 MG tablet Take 1 tablet (100 mg total) by mouth daily. 7 tablet 0   metFORMIN (GLUCOPHAGE) 1000 MG tablet TAKE 1 TABLET (1,000 MG TOTAL) BY MOUTH TWICE A DAY WITH FOOD 180 tablet 0   promethazine-dextromethorphan (PROMETHAZINE-DM) 6.25-15 MG/5ML syrup Take 5 mLs by mouth at bedtime as needed for cough. 60 mL 0   tamsulosin (FLOMAX) 0.4 MG CAPS capsule TAKE 1 CAPSULE BY MOUTH EVERYDAY AT BEDTIME 90 capsule 1   No current facility-administered medications for this visit.    Allergies: Patient has no known allergies.  Past Medical History:  Diagnosis Date   Calcaneus fracture, right    Diabetes mellitus without complication ([place])    History of kidney stones    Kidney stone    Shingles     Past Surgical History:  Procedure Laterality Date   EXTRACORPOREAL SHOCK WAVE LITHOTRIPSY Left 07/28/2019   Procedure: EXTRACORPOREAL SHOCK WAVE LITHOTRIPSY (ESWL);  Surgeon: Lucas Mallow, MD;  Location: [facility];  Service: Urology;  Laterality: Left;   LITHOTRIPSY     ORIF CALCANEOUS FRACTURE Right 04/25/2015   Procedure: OPEN REDUCTION INTERNAL FIXATION (ORIF) RIGHT CALCANEOUS FRACTURE;  Surgeon: Newt Minion, MD;  Location: [facility];  Service: Orthopedics;  Laterality: Right;   URETEROSCOPY      Family History  Problem Relation Age of Onset   Hypertension Mother    Heart attack Brother    Stomach cancer Other    Rectal cancer Other    Esophageal cancer Other    Colon polyps Other    Colon cancer Other     Social History   Tobacco Use   Smoking status: Never   Smokeless tobacco: Never  Substance Use Topics   Alcohol use: No    Subjective:  Urinary frequency/ sense of incomplete emptying x 3 days; has been on Flomax for a few months with initial improvement; no burning on urination, no fever; no blood in urine;    Objective:  Vitals:   10/13/22 1550  BP: 122/62  Pulse: 88  Resp: 18  SpO2: 96%  Weight: 212 lb 6.4 oz (96.3 kg)  Height: '6\' 4"'$  (1.93 m)    General: Well developed, well nourished, in no acute distress  Skin : Warm and dry.  Head: Normocephalic and atraumatic  Lungs: Respirations unlabored;  Neurologic: Alert and oriented; speech intact; face symmetrical; moves all extremities well; CNII-XII intact without  focal deficit   Assessment:  1. Urinary frequency   2. Acute prostatitis   3. Type 2 diabetes mellitus without complication, without long-term current use of insulin ([place])     Plan:  & 2. Check CMP, PSA, urine culture; Rx for Doxycycline 100 mg bid x 10 days; increase Flomax to 0.8 mg for next 5-7 days;  3.  Check Hgba1c; to consider GLP-1 if insurance will approve;   No follow-ups on file.  Orders Placed This Encounter  Procedures   Urine Culture   Comp Met (CMET)   Hemoglobin A1c   PSA    Requested Prescriptions   Signed Prescriptions Disp Refills   doxycycline (VIBRA-TABS) 100 MG tablet 20 tablet 0    Sig: Take 1 tablet (100 mg total) by mouth  2 (two) times daily.

## 2022-10-14 LAB — HEMOGLOBIN A1C
Hgb A1c MFr Bld: 10.4 % of total Hgb — ABNORMAL HIGH (ref <5.7)
Mean Plasma Glucose: 252 mg/dL
eAG (mmol/L): 13.9 mmol/L

## 2022-10-14 LAB — COMPREHENSIVE METABOLIC PANEL
AG Ratio: 1.7 (calc) (ref 1.0–2.5)
ALT: 19 U/L (ref 9–46)
AST: 13 U/L (ref 10–35)
Albumin: 4 g/dL (ref 3.6–5.1)
Alkaline phosphatase (APISO): 104 U/L (ref 35–144)
BUN: 17 mg/dL (ref 7–25)
CO2: 28 mmol/L (ref 20–32)
Calcium: 9.7 mg/dL (ref 8.6–10.3)
Chloride: 103 mmol/L (ref 98–110)
Creat: 1.25 mg/dL (ref 0.70–1.35)
Globulin: 2.4 g/dL (calc) (ref 1.9–3.7)
Glucose, Bld: 247 mg/dL — ABNORMAL HIGH (ref 65–99)
Potassium: 4.1 mmol/L (ref 3.5–5.3)
Sodium: 140 mmol/L (ref 135–146)
Total Bilirubin: 1 mg/dL (ref 0.2–1.2)
Total Protein: 6.4 g/dL (ref 6.1–8.1)

## 2022-10-14 LAB — PSA: PSA: 2.74 ng/mL (ref <=4.00)

## 2022-10-15 LAB — URINE CULTURE
MICRO NUMBER:: 14448256
SPECIMEN QUALITY:: ADEQUATE

## 2022-10-16 ENCOUNTER — Other Ambulatory Visit: Payer: Self-pay | Admitting: Family

## 2022-10-16 MED ORDER — AMOXICILLIN-POT CLAVULANATE 875-125 MG PO TABS: 1.0000 | ORAL_TABLET | Freq: Two times a day (BID) | ORAL | 0 refills | Status: AC

## 2022-10-16 MED ORDER — TIRZEPATIDE 2.5 MG/0.5ML ~~LOC~~ SOAJ: 2.5000 mg | SUBCUTANEOUS | 0 refills | Status: DC

## 2022-11-09 ENCOUNTER — Other Ambulatory Visit: Payer: Self-pay | Admitting: Family

## 2022-11-09 ENCOUNTER — Telehealth: Payer: Self-pay | Admitting: Family

## 2022-11-09 NOTE — Telephone Encounter (Signed)
Pt called and lvm to return call ?

## 2022-11-09 NOTE — Telephone Encounter (Signed)
Can you call to see if he was able to start the [facility]? If yes, how is he doing on this medication? Is he checking his blood sugar regularly? He should still be taking his Metformin 2 x per day regardless.

## 2022-11-09 NOTE — Telephone Encounter (Signed)
Pt stated he is unaware if he has picked up the mounjaro , but will check when he gets home in a few hours and call us back  Stated he has been taking the Metformin BID and his lowest blood sugar was 168 and his highest was 214

## 2022-11-10 ENCOUNTER — Other Ambulatory Visit: Payer: Self-pay | Admitting: Family

## 2022-11-10 DIAGNOSIS — E119 Type 2 diabetes mellitus without complications: Secondary | ICD-10-CM

## 2022-11-10 NOTE — Telephone Encounter (Signed)
Called pt left a VM to call our office back to discuss his diabetic meds and to inform pt a referral has to been sent to endocrinology.

## 2022-11-15 ENCOUNTER — Other Ambulatory Visit: Payer: Self-pay | Admitting: Family

## 2022-11-15 NOTE — Telephone Encounter (Signed)
Spoke with pt, he states he did not pick up the [facility] due to the cost of $500, pt states he is taking Metformin and Flomax, pt was informed a referral has been placed for him to go to the endocrinologist pt expressed understanding. Pt has a health screening at work today and will get his A1C checked.

## 2022-11-16 NOTE — Telephone Encounter (Signed)
Spoke with pt, pt states his A1C was 8.4 at his work screening.

## 2022-11-21 ENCOUNTER — Other Ambulatory Visit: Payer: Self-pay | Admitting: Family

## 2022-11-21 DIAGNOSIS — Z87438 Personal history of other diseases of male genital organs: Secondary | ICD-10-CM

## 2022-11-21 MED ORDER — TAMSULOSIN HCL 0.4 MG PO CAPS: ORAL_CAPSULE | ORAL | 1 refills | Status: DC

## 2022-11-21 MED ORDER — GLIMEPIRIDE 2 MG PO TABS: 2.0000 mg | ORAL_TABLET | Freq: Every day | ORAL | 1 refills | Status: DC

## 2022-11-21 NOTE — Telephone Encounter (Signed)
Spoke with pt. Pt states he is okay with adding the Glimepiride. Pt states he rechecked his A1C and it ws a 7.9 Pt also states he was previously prescribed a Rx for an enlarged prostate and was wondering if he could have another prescription of it.

## 2023-01-09 ENCOUNTER — Encounter: Payer: Self-pay | Admitting: *Deleted

## 2023-02-04 ENCOUNTER — Other Ambulatory Visit: Payer: Self-pay | Admitting: Family

## 2023-02-18 ENCOUNTER — Other Ambulatory Visit: Payer: Self-pay | Admitting: Family

## 2023-02-22 ENCOUNTER — Telehealth: Payer: Self-pay | Admitting: Family

## 2023-02-22 NOTE — Telephone Encounter (Signed)
Error

## 2023-05-18 ENCOUNTER — Telehealth: Payer: Self-pay | Admitting: Family

## 2023-05-18 NOTE — Telephone Encounter (Signed)
Pt dropped off form to be filled out by provider ([facility]) Pt is wanting document to be faxed  when ready to [phone], pt would like to be notified when document is faxed. Pt mentioned that is needing document to be sent out before May 26, 2023. Please advise. ASAP. Document put at front office tray under providers name.

## 2023-05-21 NOTE — Telephone Encounter (Signed)
Form has been placed in PCP folder to be reviewed and signed.

## 2023-05-22 ENCOUNTER — Other Ambulatory Visit: Payer: Self-pay | Admitting: Family

## 2023-05-22 ENCOUNTER — Other Ambulatory Visit (INDEPENDENT_AMBULATORY_CARE_PROVIDER_SITE_OTHER): Payer: No Typology Code available for payment source

## 2023-05-22 DIAGNOSIS — E1165 Type 2 diabetes mellitus with hyperglycemia: Secondary | ICD-10-CM

## 2023-05-23 LAB — COMPREHENSIVE METABOLIC PANEL
ALT: 16 U/L (ref 0–53)
AST: 14 U/L (ref 0–37)
Albumin: 4.2 g/dL (ref 3.5–5.2)
Alkaline Phosphatase: 106 U/L (ref 39–117)
BUN: 18 mg/dL (ref 6–23)
CO2: 27 meq/L (ref 19–32)
Calcium: 9.7 mg/dL (ref 8.4–10.5)
Chloride: 101 meq/L (ref 96–112)
Creatinine, Ser: 1.17 mg/dL (ref 0.40–1.50)
GFR: 65.7 mL/min (ref >60.00)
Glucose, Bld: 207 mg/dL — ABNORMAL HIGH (ref 70–99)
Potassium: 4.1 meq/L (ref 3.5–5.1)
Sodium: 136 meq/L (ref 135–145)
Total Bilirubin: 1.6 mg/dL — ABNORMAL HIGH (ref 0.2–1.2)
Total Protein: 6.4 g/dL (ref 6.0–8.3)

## 2023-05-23 LAB — HEMOGLOBIN A1C: Hgb A1c MFr Bld: 8.9 % — ABNORMAL HIGH (ref 4.6–6.5)

## 2023-05-24 ENCOUNTER — Other Ambulatory Visit: Payer: Self-pay | Admitting: Family

## 2023-05-24 MED ORDER — RYBELSUS 3 MG PO TABS: 3.0000 mg | ORAL_TABLET | Freq: Every day | ORAL | 0 refills | Status: DC

## 2023-05-29 NOTE — Telephone Encounter (Signed)
Pt would like the paperwork faxed again. States the UC has not received it.

## 2023-05-29 NOTE — Telephone Encounter (Signed)
Requested forms have been emailed over for pt, spoke with pt to confirm documents have been received.

## 2023-05-29 NOTE — Telephone Encounter (Signed)
Pt is at urgent care and they have not received paperwork. Pt provided email address to try.  [email or web address]   Please call pt to discuss. He's at the office now.

## 2023-05-30 ENCOUNTER — Other Ambulatory Visit (HOSPITAL_COMMUNITY): Payer: Self-pay

## 2023-05-30 ENCOUNTER — Telehealth: Payer: Self-pay

## 2023-05-30 NOTE — Telephone Encounter (Signed)
Pharmacy Patient Advocate Encounter   Received notification from CoverMyMeds that prior authorization for RYBELSUS is required/requested.   Insurance verification completed.   The patient is insured through  [employer]  .   Per test claim: PA required; PA submitted to [facility]  via CoverMyMeds Key/confirmation #/EOC Key: [id number]       Status is pending

## 2023-06-01 NOTE — Telephone Encounter (Signed)
Pharmacy Patient Advocate Encounter  Received notification from  [employer]  that Prior Authorization for Rybelsus 3MG  has been APPROVED from 06/01/23 to 05/31/24   PA #/Case ID/Reference #: [id number]

## 2023-06-05 NOTE — Telephone Encounter (Signed)
Please make sure he knows Rybelsus is covered; go ahead and start the 3 mg dosage-will discuss increasing dosage at his next appointment. He does stay on the Metformin as well.

## 2023-06-05 NOTE — Telephone Encounter (Signed)
Spoke with pt, pt is aware and expressed understanding.  

## 2023-06-22 ENCOUNTER — Ambulatory Visit: Payer: No Typology Code available for payment source | Admitting: Family

## 2023-08-28 ENCOUNTER — Other Ambulatory Visit: Payer: Self-pay | Admitting: Family

## 2023-08-28 DIAGNOSIS — Z87438 Personal history of other diseases of male genital organs: Secondary | ICD-10-CM

## 2023-09-14 ENCOUNTER — Other Ambulatory Visit: Payer: Self-pay | Admitting: Family

## 2023-09-14 DIAGNOSIS — Z87438 Personal history of other diseases of male genital organs: Secondary | ICD-10-CM

## 2023-10-10 ENCOUNTER — Other Ambulatory Visit: Payer: Self-pay | Admitting: Family

## 2023-10-10 DIAGNOSIS — Z87438 Personal history of other diseases of male genital organs: Secondary | ICD-10-CM

## 2023-10-24 ENCOUNTER — Other Ambulatory Visit: Payer: Self-pay | Admitting: Family

## 2023-10-24 DIAGNOSIS — Z87438 Personal history of other diseases of male genital organs: Secondary | ICD-10-CM

## 2023-12-11 ENCOUNTER — Other Ambulatory Visit: Payer: Self-pay | Admitting: Family

## 2023-12-11 DIAGNOSIS — Z87438 Personal history of other diseases of male genital organs: Secondary | ICD-10-CM

## 2023-12-11 NOTE — Telephone Encounter (Signed)
 Last Fill: 09/14/23  Last OV: 10/13/22 Next OV: 12/18/23  Routing to provider for review/authorization.

## 2023-12-11 NOTE — Telephone Encounter (Signed)
 Copied from CRM [id number]. Topic: Clinical - Medication Refill >> Dec 11, 2023  3:43 PM Denese Killings wrote: Most Recent Primary Care Visit:  Provider: [facility]  Department[facility]  Visit Type: LAB  Date: 05/22/2023  Medication: tamsulosin (FLOMAX) 0.4 MG CAPS capsule  Has the patient contacted their pharmacy? Yes (Agent: If no, request that the patient contact the pharmacy for the refill. If patient does not wish to contact the pharmacy document the reason why and proceed with request.) (Agent: If yes, when and what did the pharmacy advise?) Advised to contact doctor   Is this the correct pharmacy for this prescription? Yes If no, delete pharmacy and type the correct one.  This is the patient's preferred pharmacy:  [id number] [place], [place] - [address] [place] [address] Phone: [phone] Fax: [phone]   Has the prescription been filled recently? No  Is the patient out of the medication? Yes  Has the patient been seen for an appointment in the last year OR does the patient have an upcoming appointment? Yes  Can we respond through MyChart? No  Agent: Please be advised that Rx refills may take up to 3 business days. We ask that you follow-up with your pharmacy.

## 2023-12-12 MED ORDER — TAMSULOSIN HCL 0.4 MG PO CAPS: ORAL_CAPSULE | ORAL | 0 refills | Status: DC

## 2023-12-18 ENCOUNTER — Encounter: Payer: Self-pay | Admitting: Family

## 2023-12-18 ENCOUNTER — Ambulatory Visit (INDEPENDENT_AMBULATORY_CARE_PROVIDER_SITE_OTHER): Admitting: Family

## 2023-12-18 VITALS — BP 118/72 | HR 73 | Ht 76.0 in | Wt 217.0 lb

## 2023-12-18 DIAGNOSIS — Z860101 Personal history of adenomatous and serrated colon polyps: Secondary | ICD-10-CM

## 2023-12-18 DIAGNOSIS — N401 Enlarged prostate with lower urinary tract symptoms: Secondary | ICD-10-CM

## 2023-12-18 DIAGNOSIS — Z87438 Personal history of other diseases of male genital organs: Secondary | ICD-10-CM

## 2023-12-18 DIAGNOSIS — E119 Type 2 diabetes mellitus without complications: Secondary | ICD-10-CM

## 2023-12-18 DIAGNOSIS — R350 Frequency of micturition: Secondary | ICD-10-CM | POA: Diagnosis not present

## 2023-12-18 DIAGNOSIS — Z7984 Long term (current) use of oral hypoglycemic drugs: Secondary | ICD-10-CM

## 2023-12-18 DIAGNOSIS — Z1211 Encounter for screening for malignant neoplasm of colon: Secondary | ICD-10-CM

## 2023-12-18 MED ORDER — TADALAFIL 20 MG PO TABS: 10.0000 mg | ORAL_TABLET | ORAL | 0 refills | Status: DC | PRN

## 2023-12-18 MED ORDER — TAMSULOSIN HCL 0.4 MG PO CAPS: ORAL_CAPSULE | ORAL | 3 refills | Status: AC

## 2023-12-18 NOTE — Progress Notes (Signed)
 Stephen Gill is a 68 y.o. male with the following history as recorded in EpicCare:  Patient Active Problem List   Diagnosis Date Noted   Primary osteoarthritis of left knee 03/09/2021   Patellofemoral pain syndrome of left knee 02/09/2021    Current Outpatient Medications  Medication Sig Dispense Refill   albuterol (VENTOLIN HFA) 108 (90 Base) MCG/ACT inhaler Inhale 2 puffs into the lungs every 6 (six) hours as needed for wheezing or shortness of breath (Cough). 54 g 1   metFORMIN (GLUCOPHAGE) 1000 MG tablet Take 1 tablet (1,000 mg total) by mouth 2 (two) times daily with a meal. MUST SCHEDULE OV FOR REFILLS 60 tablet 0   tadalafil (CIALIS) 20 MG tablet Take 0.5-1 tablets (10-20 mg total) by mouth every other day as needed for erectile dysfunction. 10 tablet 0   Continuous Blood Gluc Sensor (FREESTYLE LIBRE 3 SENSOR) MISC Apply 1 Units topically as directed. Place 1 sensor on the skin every 14 days. Use to check glucose continuously (Patient not taking: Reported on 12/18/2023) 1 each 6   tamsulosin (FLOMAX) 0.4 MG CAPS capsule TAKE 1 CAPSULE BY MOUTH EVERYDAY AT BEDTIME 90 capsule 3   No current facility-administered medications for this visit.    Allergies: Patient has no known allergies.  Past Medical History:  Diagnosis Date   Calcaneus fracture, right    Diabetes mellitus without complication ([facility])    History of kidney stones    Kidney stone    Shingles     Past Surgical History:  Procedure Laterality Date   EXTRACORPOREAL SHOCK WAVE LITHOTRIPSY Left 07/28/2019   Procedure: EXTRACORPOREAL SHOCK WAVE LITHOTRIPSY (ESWL);  Surgeon: Crista Elliot, MD;  Location: [facility];  Service: Urology;  Laterality: Left;   LITHOTRIPSY     ORIF CALCANEOUS FRACTURE Right 04/25/2015   Procedure: OPEN REDUCTION INTERNAL FIXATION (ORIF) RIGHT CALCANEOUS FRACTURE;  Surgeon: Nadara Mustard, MD;  [facility];  Service: Orthopedics;  Laterality: Right;   URETEROSCOPY      Family History  Problem  Relation Age of Onset   Hypertension Mother    Heart attack Brother    Stomach cancer Other    Rectal cancer Other    Esophageal cancer Other    Colon polyps Other    Colon cancer Other     Social History   Tobacco Use   Smoking status: Never   Smokeless tobacco: Never  Substance Use Topics   Alcohol use: No    Subjective:   Follow up on Type 2 Diabetes- last OV here was January 2024;  Has only been taking Metformin 1000 mg once per day; patient did not start Rybelsus as prescribed; patient did not want to take Amaryl due to it "making his bones ache." Patient is planning to see urology ( Dr. Sabino Gasser)- waiting to get a callback to schedule;   Objective:  Vitals:   12/18/23 1339  BP: 118/72  Pulse: 73  SpO2: 98%  Weight: 217 lb (98.4 kg)  Height: 6\' 4"  (1.93 m)    General: Well developed, well nourished, in no acute distress  Skin : Warm and dry.  Head: Normocephalic and atraumatic  Eyes: Sclera and conjunctiva clear; pupils round and reactive to light; extraocular movements intact  Ears: External normal; canals clear; tympanic membranes normal  Oropharynx: Pink, supple. No suspicious lesions  Neck: Supple without thyromegaly, adenopathy  Lungs: Respirations unlabored; clear to auscultation bilaterally without wheeze, rales, rhonchi  CVS exam: normal rate and regular rhythm.  Neurologic: Alert and oriented; speech intact; face symmetrical; moves all extremities well; CNII-XII intact without focal deficit   Assessment:  1. Benign prostatic hyperplasia with urinary frequency   2. Encounter for colonoscopy due to history of adenomatous colonic polyps   3. Type 2 diabetes mellitus without complication, without long-term current use of insulin ([facility])   4. 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  Orders Placed This Encounter  Procedures   CBC with Differential/Platelet   Comp Met (CMET)   Lipid panel   PSA   Hemoglobin A1c   Ambulatory referral to Urology    Referral Priority:   Routine    Referral Type:   Consultation    Referral Reason:   Specialty Services Required    Requested Specialty:   Urology    Number of Visits Requested:   1   Ambulatory referral to Gastroenterology    Referral Priority:   Routine    Referral Type:   Consultation    Referral Reason:   Specialty Services Required    Referred to Provider:   Sherrilyn Rist, MD    Number of Visits Requested:   1    Requested Prescriptions   Signed Prescriptions Disp Refills   tadalafil (CIALIS) 20 MG tablet 10 tablet 0    Sig: Take 0.5-1 tablets (10-20 mg total) by mouth every other day as needed for erectile dysfunction.   tamsulosin (FLOMAX) 0.4 MG CAPS capsule 90 capsule 3    Sig: TAKE 1 CAPSULE BY MOUTH EVERYDAY AT BEDTIME

## 2023-12-19 LAB — LIPID PANEL
Cholesterol: 186 mg/dL (ref 0–200)
HDL: 41.1 mg/dL (ref >39.00)
LDL Cholesterol: 136 mg/dL — ABNORMAL HIGH (ref 0–99)
NonHDL: 145.1
Total CHOL/HDL Ratio: 5
Triglycerides: 47 mg/dL (ref 0.0–149.0)
VLDL: 9.4 mg/dL (ref 0.0–40.0)

## 2023-12-19 LAB — COMPREHENSIVE METABOLIC PANEL WITH GFR
ALT: 11 U/L (ref 0–53)
AST: 13 U/L (ref 0–37)
Albumin: 4.6 g/dL (ref 3.5–5.2)
Alkaline Phosphatase: 105 U/L (ref 39–117)
BUN: 16 mg/dL (ref 6–23)
CO2: 28 meq/L (ref 19–32)
Calcium: 10.2 mg/dL (ref 8.4–10.5)
Chloride: 102 meq/L (ref 96–112)
Creatinine, Ser: 1.12 mg/dL (ref 0.40–1.50)
GFR: 68.95 mL/min (ref >60.00)
Glucose, Bld: 181 mg/dL — ABNORMAL HIGH (ref 70–99)
Potassium: 4.2 meq/L (ref 3.5–5.1)
Sodium: 138 meq/L (ref 135–145)
Total Bilirubin: 1.7 mg/dL — ABNORMAL HIGH (ref 0.2–1.2)
Total Protein: 6.8 g/dL (ref 6.0–8.3)

## 2023-12-19 LAB — CBC WITH DIFFERENTIAL/PLATELET
Basophils Absolute: 0.1 10*3/uL (ref 0.0–0.1)
Basophils Relative: 1.2 % (ref 0.0–3.0)
Eosinophils Absolute: 0.1 10*3/uL (ref 0.0–0.7)
Eosinophils Relative: 1.5 % (ref 0.0–5.0)
HCT: 38.9 % — ABNORMAL LOW (ref 39.0–52.0)
Hemoglobin: 12.8 g/dL — ABNORMAL LOW (ref 13.0–17.0)
Lymphocytes Relative: 35.1 % (ref 12.0–46.0)
Lymphs Abs: 1.8 10*3/uL (ref 0.7–4.0)
MCHC: 32.8 g/dL (ref 30.0–36.0)
MCV: 93.8 fl (ref 78.0–100.0)
Monocytes Absolute: 0.4 10*3/uL (ref 0.1–1.0)
Monocytes Relative: 8.6 % (ref 3.0–12.0)
Neutro Abs: 2.8 10*3/uL (ref 1.4–7.7)
Neutrophils Relative %: 53.6 % (ref 43.0–77.0)
Platelets: 178 10*3/uL (ref 150.0–400.0)
RBC: 4.15 Mil/uL — ABNORMAL LOW (ref 4.22–5.81)
RDW: 13.4 % (ref 11.5–15.5)
WBC: 5.2 10*3/uL (ref 4.0–10.5)

## 2023-12-19 LAB — HEMOGLOBIN A1C: Hgb A1c MFr Bld: 10.1 % — ABNORMAL HIGH (ref 4.6–6.5)

## 2023-12-20 LAB — PSA: PSA: 2.24 ng/mL (ref 0.10–4.00)

## 2023-12-21 ENCOUNTER — Telehealth: Payer: Self-pay

## 2023-12-21 ENCOUNTER — Other Ambulatory Visit: Payer: Self-pay | Admitting: Family

## 2023-12-21 DIAGNOSIS — E119 Type 2 diabetes mellitus without complications: Secondary | ICD-10-CM

## 2023-12-21 MED ORDER — RYBELSUS 3 MG PO TABS: 3.0000 mg | ORAL_TABLET | Freq: Every day | ORAL | 0 refills | Status: DC

## 2023-12-21 NOTE — Telephone Encounter (Signed)
 Pharmacy Patient Advocate Encounter   Received notification from CoverMyMeds that prior authorization for  Rybelsus 3MG  tabletsis required/requested.   Insurance verification completed.   The patient is insured through [facility] .   Per test claim: PA required; PA submitted to above mentioned insurance via CoverMyMeds Key/confirmation #/EOC [id number] Status is pending

## 2023-12-24 NOTE — Telephone Encounter (Signed)
 Pharmacy Patient Advocate Encounter  Received notification from [facility] that Prior Authorization for Rybelsus 3MG  tablet has been APPROVED from 12/24/2023 to 09/24/2024   PA #/Case ID/Reference #: [id number].

## 2024-01-01 ENCOUNTER — Encounter: Payer: Self-pay | Admitting: Gastroenterology

## 2024-01-16 ENCOUNTER — Ambulatory Visit: Admitting: Urology

## 2024-01-17 ENCOUNTER — Encounter: Payer: Self-pay | Admitting: Family

## 2024-01-17 ENCOUNTER — Ambulatory Visit (INDEPENDENT_AMBULATORY_CARE_PROVIDER_SITE_OTHER): Admitting: Family

## 2024-01-17 VITALS — BP 116/81 | HR 86 | Ht 76.0 in | Wt 215.0 lb

## 2024-01-17 DIAGNOSIS — M25511 Pain in right shoulder: Secondary | ICD-10-CM

## 2024-01-17 MED ORDER — TADALAFIL 20 MG PO TABS: 10.0000 mg | ORAL_TABLET | ORAL | 1 refills | Status: AC | PRN

## 2024-01-17 MED ORDER — MELOXICAM 15 MG PO TABS: 15.0000 mg | ORAL_TABLET | Freq: Every day | ORAL | 0 refills | Status: DC

## 2024-01-17 NOTE — Progress Notes (Unsigned)
 Stephen Gill is a 68 y.o. male with the following history as recorded in EpicCare:  Patient Active Problem List   Diagnosis Date Noted   Primary osteoarthritis of left knee 03/09/2021   Patellofemoral pain syndrome of left knee 02/09/2021    Current Outpatient Medications  Medication Sig Dispense Refill   meloxicam  (MOBIC ) 15 MG tablet Take 1 tablet (15 mg total) by mouth daily. 30 tablet 0   metFORMIN  (GLUCOPHAGE ) 1000 MG tablet Take 1 tablet (1,000 mg total) by mouth 2 (two) times daily with a meal. MUST SCHEDULE OV FOR REFILLS 60 tablet 0   tamsulosin  (FLOMAX ) 0.4 MG CAPS capsule TAKE 1 CAPSULE BY MOUTH EVERYDAY AT BEDTIME 90 capsule 3   Continuous Blood Gluc Sensor (FREESTYLE LIBRE 3 SENSOR) MISC Apply 1 Units topically as directed. Place 1 sensor on the skin every 14 days. Use to check glucose continuously (Patient not taking: Reported on 01/17/2024) 1 each 6   tadalafil  (CIALIS ) 20 MG tablet Take 0.5-1 tablets (10-20 mg total) by mouth every other day as needed for erectile dysfunction. 10 tablet 1   No current facility-administered medications for this visit.    Allergies: Patient has no known allergies.  Past Medical History:  Diagnosis Date   Calcaneus fracture, right    Diabetes mellitus without complication ([facility])    History of kidney stones    Kidney stone    Shingles     Past Surgical History:  Procedure Laterality Date   EXTRACORPOREAL SHOCK WAVE LITHOTRIPSY Left 07/28/2019   Procedure: EXTRACORPOREAL SHOCK WAVE LITHOTRIPSY (ESWL);  Surgeon: Samson Croak, MD;  [facility]: [facility];  Service: Urology;  Laterality: Left;   LITHOTRIPSY     ORIF CALCANEOUS FRACTURE Right 04/25/2015   Procedure: OPEN REDUCTION INTERNAL FIXATION (ORIF) RIGHT CALCANEOUS FRACTURE;  Surgeon: Timothy Sanjose, MD;  [facility]: [facility];  Service: Orthopedics;  Laterality: Right;   URETEROSCOPY      Family History  Problem Relation Age of Onset   Hypertension Mother    Heart attack Brother     Stomach cancer Other    Rectal cancer Other    Esophageal cancer Other    Colon polyps Other    Colon cancer Other     Social History   Tobacco Use   Smoking status: Never   Smokeless tobacco: Never  Substance Use Topics   Alcohol use: No    Subjective:   Right shoulder pain x 4 days; 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  Head: Normocephalic and atraumatic  Lungs: Respirations unlabored;  Musculoskeletal: No deformities; no active joint inflammation  Neurologic: Alert and oriented; speech intact; face symmetrical; moves all extremities well; CNII-XII intact without focal deficit   Assessment:  1. Acute pain of right shoulder     Plan:  Suspect tendonitis; Rx for Mobic  15 mg po every day x 5-7 days; follow up worse, no better and will consider referral to sports medicine.   No follow-ups on file.  No orders of the defined types were placed in this encounter.   Requested Prescriptions   Signed Prescriptions Disp Refills   tadalafil  (  CIALIS ) 20 MG tablet 10 tablet 1    Sig: Take 0.5-1 tablets (10-20 mg total) by mouth every other day as needed for erectile dysfunction.   meloxicam  (MOBIC ) 15 MG tablet 30 tablet 0    Sig: Take 1 tablet (15 mg total) by mouth daily.

## 2024-02-15 ENCOUNTER — Other Ambulatory Visit: Payer: Self-pay | Admitting: Family

## 2024-02-24 NOTE — Progress Notes (Incomplete)
 Chief Complaint:    History of Present Illness:  Stephen Gill is a 68 y.o. male who is seen in consultation from Adra Alanis, FNP for evaluation of BPH with lower urinary tract symptoms.  He has been on tamsulosin  for approximately 3 years.  With this, he is fairly happy with his urinary situation.  Current IPSS 7/5.  He is satisfied with tamsulosin , however.  He states that rarely he has initial gross painless hematuria.  This has happened occasionally for a while.  He does have a history of kidney stones, having passed his last stone about 3 years ago.  He does have ED.  He takes 10 to 20 mg of tadalafil  at a time.  This works fine without significant side effects.    Past Medical History:  Past Medical History:  Diagnosis Date   Calcaneus fracture, right    Diabetes mellitus without complication ([facility])    History of kidney stones    Kidney stone    Shingles     Past Surgical History:  Past Surgical History:  Procedure Laterality Date   EXTRACORPOREAL SHOCK WAVE LITHOTRIPSY Left 07/28/2019   Procedure: EXTRACORPOREAL SHOCK WAVE LITHOTRIPSY (ESWL);  Surgeon: Samson Croak, MD;  Location: [facility];  Service: Urology;  Laterality: Left;   LITHOTRIPSY     ORIF CALCANEOUS FRACTURE Right 04/25/2015   Procedure: OPEN REDUCTION INTERNAL FIXATION (ORIF) RIGHT CALCANEOUS FRACTURE;  Surgeon: Timothy Handlin, MD;  [facility];  Service: Orthopedics;  Laterality: Right;   URETEROSCOPY      Allergies:  No Known Allergies  Family History:  Family History  Problem Relation Age of Onset   Hypertension Mother    Heart attack Brother    Stomach cancer Other    Rectal cancer Other    Esophageal cancer Other    Colon polyps Other    Colon cancer Other     Social History:  Social History   Tobacco Use   Smoking status: Never   Smokeless tobacco: Never  Vaping Use   Vaping status: Never Used  Substance Use Topics   Alcohol use: No   Drug use: No    Review  of symptoms:  Constitutional:  Negative for unexplained weight loss, night sweats, fever, chills ENT:  Negative for nose bleeds, sinus pain, painful swallowing CV:  Negative for chest pain, shortness of breath, exercise intolerance, palpitations, loss of consciousness Resp:  Negative for cough, wheezing, shortness of breath GI:  Negative for nausea, vomiting, diarrhea, bloody stools GU:  Positives noted in HPI; otherwise negative for gross hematuria, dysuria, urinary incontinence Neuro:  Negative for seizures, poor balance, limb weakness, slurred speech Psych:  Negative for lack of energy, depression, anxiety Endocrine:  Negative for polydipsia, polyuria, symptoms of hypoglycemia (dizziness, hunger, sweating) Hematologic:  Negative for anemia, purpura, petechia, prolonged or excessive bleeding, use of anticoagulants  Allergic:  Negative for difficulty breathing or choking as a result of exposure to anything; no shellfish allergy; no allergic response (rash/itch) to materials, foods  Physical exam: There were no vitals taken for this visit. GENERAL APPEARANCE:  Well appearing, well developed, well nourished, NAD HEENT: Atraumatic, Normocephalic. NECK: Normal appearance LUNGS: Normal inspiratory and expiratory excursion HEART: Regular Rate ABDOMEN: No inguinal hernias GU: Uncircumcised phallus.  Meatus normal.  No scrotal or penile skin lesions.  Testicles and epididymis is normal. EXTREMITIES: Moves all extremities well.  Without clubbing, cyanosis, or edema. NEUROLOGIC:  Alert and oriented x 3, normal gait,  CN II-XII grossly intact.  MENTAL STATUS:  Appropriate. SKIN:  Warm, dry and intact.    Results:  I have reviewed referring/prior physicians records  I have reviewed urinalysis--she does have microscopic hematuria today  I have reviewed prior 2.24 (3.25..2025)  Estimated prostate volume from CT scan from 2022-45 mL  Bladder scan volume today 50 mL before he voided  CT images  from 2022 reviewed-right greater than left renal calculi  Assessment: -BPH, adequately treated with tamsulosin .  He seems to be emptying well  -ED, on tadalafil .  He would like a refill  -History of gross initial hematuria.  Not documented today   Plan: -I think it is fine for him to continue on the tamsulosin   -I refilled his tadalafil   - We will call him about his hematuria (specimen given at the end of office visit).  I will have him come back in 6 weeks to recheck urine, and if persistent hematuria, he will need evaluation.

## 2024-02-25 ENCOUNTER — Other Ambulatory Visit: Payer: Self-pay | Admitting: Family

## 2024-02-25 ENCOUNTER — Other Ambulatory Visit (HOSPITAL_BASED_OUTPATIENT_CLINIC_OR_DEPARTMENT_OTHER): Payer: Self-pay

## 2024-02-25 ENCOUNTER — Ambulatory Visit (INDEPENDENT_AMBULATORY_CARE_PROVIDER_SITE_OTHER): Payer: PRIVATE HEALTH INSURANCE | Admitting: Urology

## 2024-02-25 ENCOUNTER — Encounter: Payer: Self-pay | Admitting: Urology

## 2024-02-25 VITALS — BP 127/80 | HR 67 | Ht 76.0 in | Wt 225.0 lb

## 2024-02-25 DIAGNOSIS — N5201 Erectile dysfunction due to arterial insufficiency: Secondary | ICD-10-CM

## 2024-02-25 DIAGNOSIS — N401 Enlarged prostate with lower urinary tract symptoms: Secondary | ICD-10-CM | POA: Diagnosis not present

## 2024-02-25 DIAGNOSIS — R310 Gross hematuria: Secondary | ICD-10-CM

## 2024-02-25 LAB — URINALYSIS, ROUTINE W REFLEX MICROSCOPIC
Bilirubin, UA: NEGATIVE
Ketones, UA: NEGATIVE
Leukocytes,UA: NEGATIVE
Nitrite, UA: NEGATIVE
Specific Gravity, UA: >1.03 — ABNORMAL HIGH (ref 1.005–1.030)
Urobilinogen, Ur: 0.2 mg/dL (ref 0.2–1.0)
pH, UA: 5.5 (ref 5.0–7.5)

## 2024-02-25 LAB — BLADDER SCAN AMB NON-IMAGING: Scan Result: 50

## 2024-02-25 LAB — MICROSCOPIC EXAMINATION

## 2024-02-25 MED ORDER — TADALAFIL 20 MG PO TABS
10.0000 mg | ORAL_TABLET | ORAL | 11 refills | Status: AC | PRN
  Filled 2024-02-25: qty 15, 15d supply, fill #0

## 2024-02-26 ENCOUNTER — Telehealth: Payer: Self-pay

## 2024-02-26 ENCOUNTER — Other Ambulatory Visit (HOSPITAL_BASED_OUTPATIENT_CLINIC_OR_DEPARTMENT_OTHER): Payer: Self-pay

## 2024-02-26 ENCOUNTER — Other Ambulatory Visit: Payer: Self-pay | Admitting: Family

## 2024-02-26 NOTE — Telephone Encounter (Signed)
 Pt needs to contact endo for refills.

## 2024-02-26 NOTE — Telephone Encounter (Signed)
 Spoke with pt in reference to microhematuria and f/u appt. Pt voiced understanding. F/u appt made.

## 2024-02-26 NOTE — Telephone Encounter (Signed)
 Copied from CRM [id number]. Topic: Clinical - Medication Refill >> Feb 26, 2024  9:29 AM Dorisann Garre T wrote: Medication: metFORMIN  (GLUCOPHAGE ) 1000 MG tablet [[id number]]  Has the patient contacted their pharmacy? Yes (Agent: If no, request that the patient contact the pharmacy for the refill. If patient does not wish to contact the pharmacy document the reason why and proceed with request.) (Agent: If yes, when and what did the pharmacy advise?)  This is the patient's preferred pharmacy:  [id number] - [address] [place] [address] Phone: [phone] Fax: [phone]    Is this the correct pharmacy for this prescription? Yes If no, delete pharmacy and type the correct one.   Has the prescription been filled recently? No  Is the patient out of the medication? Yes  Has the patient been seen for an appointment in the last year OR does the patient have an upcoming appointment? Yes  Can we respond through MyChart? No  Agent: Please be advised that Rx refills may take up to 3 business days. We ask that you follow-up with your pharmacy.

## 2024-02-26 NOTE — Telephone Encounter (Signed)
-----   Message from Malcolm Scrivener Dahlstedt sent at 02/25/2024  4:37 PM EDT ----- Please call patient-he was noted to have blood under the microscope on urinalysis.  I would like to have him come back in about 6 to 8 weeks to recheck.

## 2024-03-26 ENCOUNTER — Other Ambulatory Visit: Payer: Self-pay | Admitting: Family

## 2024-04-07 ENCOUNTER — Ambulatory Visit (AMBULATORY_SURGERY_CENTER): Payer: Self-pay | Admitting: *Deleted

## 2024-04-07 VITALS — Ht 76.0 in | Wt 225.0 lb

## 2024-04-07 DIAGNOSIS — Z860100 Personal history of colon polyps, unspecified: Secondary | ICD-10-CM

## 2024-04-07 MED ORDER — NA SULFATE-K SULFATE-MG SULF 17.5-3.13-1.6 GM/177ML PO SOLN: 1.0000 | Freq: Once | ORAL | 0 refills | Status: AC

## 2024-04-07 NOTE — Progress Notes (Signed)
 Pre visit completed over telephone.  Instructions mailed to patient's home address.    No egg or soy allergy known to patient  No issues known to pt with past sedation with any surgeries or procedures Patient denies ever being told they had issues or difficulty with intubation  No FH of Malignant Hyperthermia Pt is not on diet pills Pt is not on  home 02  Pt is not on blood thinners  Pt denies issues with constipation  No A fib or A flutter Have any cardiac testing pending-- NO Pt instructed to use Singlecare.com or GoodRx for a price reduction on prep    Chart reviewed by DOROTHA Ade, CRNA

## 2024-04-15 NOTE — Progress Notes (Signed)
 Chief Complaint:    History of Present Illness:  6.2.2025: Initial visit for evaluation of BPH with lower urinary tract symptoms.  He has been on tamsulosin  for approximately 3 years.  With this, he is fairly happy with his urinary situation.  Current IPSS 7/5.  He is satisfied with tamsulosin , however.  He states that rarely he has initial gross painless hematuria.  This has happened occasionally for a while.  He does have a history of kidney stones, having passed his last stone about 3 years ago.  He does have ED.  He takes 10 to 20 mg of tadalafil  at a time.  This works fine without significant side effects.  Prior CT was reviewed-estimated prostate volume 45 mL.  He had an acceptable postvoid residual urine volume.  He still did that he did have occasional gross painless hematuria.  7.23.2025: Here for recheck    Past Medical History:  Past Medical History:  Diagnosis Date   Calcaneus fracture, right    Diabetes mellitus without complication (HCC)    History of kidney stones    Kidney stone    Shingles     Past Surgical History:  Past Surgical History:  Procedure Laterality Date   EXTRACORPOREAL SHOCK WAVE LITHOTRIPSY Left 07/28/2019   Procedure: EXTRACORPOREAL SHOCK WAVE LITHOTRIPSY (ESWL);  Surgeon: Carolee Sherwood JONETTA DOUGLAS, MD;  Location: [facility];  Service: Urology;  Laterality: Left;   LITHOTRIPSY     ORIF CALCANEOUS FRACTURE Right 04/25/2015   Procedure: OPEN REDUCTION INTERNAL FIXATION (ORIF) RIGHT CALCANEOUS FRACTURE;  Surgeon: Jerona Harden GAILS, MD;  Location: [facility];  Service: Orthopedics;  Laterality: Right;   URETEROSCOPY      Allergies:  No Known Allergies  Family History:  Family History  Problem Relation Age of Onset   Hypertension Mother    Colon polyps Brother    Colon cancer Brother    Heart attack Brother    Esophageal cancer Neg Hx    Rectal cancer Neg Hx    Stomach cancer Neg Hx     Social History:  Social History   Tobacco Use   Smoking  status: Never   Smokeless tobacco: Never  Vaping Use   Vaping status: Never Used  Substance Use Topics   Alcohol use: No   Drug use: No    Review of symptoms:  Constitutional:  Negative for unexplained weight loss, night sweats, fever, chills ENT:  Negative for nose bleeds, sinus pain, painful swallowing CV:  Negative for chest pain, shortness of breath, exercise intolerance, palpitations, loss of consciousness Resp:  Negative for cough, wheezing, shortness of breath GI:  Negative for nausea, vomiting, diarrhea, bloody stools GU:  Positives noted in HPI; otherwise negative for gross hematuria, dysuria, urinary incontinence Neuro:  Negative for seizures, poor balance, limb weakness, slurred speech Psych:  Negative for lack of energy, depression, anxiety Endocrine:  Negative for polydipsia, polyuria, symptoms of hypoglycemia (dizziness, hunger, sweating) Hematologic:  Negative for anemia, purpura, petechia, prolonged or excessive bleeding, use of anticoagulants  Allergic:  Negative for difficulty breathing or choking as a result of exposure to anything; no shellfish allergy; no allergic response (rash/itch) to materials, foods  Physical exam: There were no vitals taken for this visit. GENERAL APPEARANCE:  Well appearing, well developed, well nourished, NAD HEENT: Atraumatic, Normocephalic. NECK: Normal appearance LUNGS: Normal inspiratory and expiratory excursion HEART: Regular Rate ABDOMEN: No inguinal hernias GU: Uncircumcised phallus.  Meatus normal.  No scrotal or penile skin lesions.  Testicles and  epididymis is normal. EXTREMITIES: Moves all extremities well.  Without clubbing, cyanosis, or edema. NEUROLOGIC:  Alert and oriented x 3, normal gait, CN II-XII grossly intact.  MENTAL STATUS:  Appropriate. SKIN:  Warm, dry and intact.    Results:  I have reviewed referring/prior physicians records  I have reviewed urinalysis--she does have microscopic hematuria today  I have  reviewed prior 2.24 (3.25..2025)  Estimated prostate volume from CT scan from 2022-45 mL  Bladder scan volume today 50 mL before he voided  CT images from 2022 reviewed-right greater than left renal calculi  Assessment: -BPH, adequately treated with tamsulosin .  He seems to be emptying well  -ED, on tadalafil .  He would like a refill  -History of gross initial hematuria.  Not documented today   Plan: -I think it is fine for him to continue on the tamsulosin   -I refilled his tadalafil   - We will call him about his hematuria (specimen given at the end of office visit).  I will have him come back in 6 weeks to recheck urine, and if persistent hematuria, he will need evaluation.

## 2024-04-16 ENCOUNTER — Ambulatory Visit (INDEPENDENT_AMBULATORY_CARE_PROVIDER_SITE_OTHER): Admitting: Urology

## 2024-04-16 VITALS — BP 116/76 | HR 86 | Ht 76.0 in | Wt 215.0 lb

## 2024-04-16 DIAGNOSIS — N138 Other obstructive and reflux uropathy: Secondary | ICD-10-CM

## 2024-04-16 DIAGNOSIS — N400 Enlarged prostate without lower urinary tract symptoms: Secondary | ICD-10-CM | POA: Diagnosis not present

## 2024-04-16 DIAGNOSIS — N529 Male erectile dysfunction, unspecified: Secondary | ICD-10-CM

## 2024-04-16 DIAGNOSIS — R3129 Other microscopic hematuria: Secondary | ICD-10-CM | POA: Diagnosis not present

## 2024-04-16 DIAGNOSIS — N5201 Erectile dysfunction due to arterial insufficiency: Secondary | ICD-10-CM

## 2024-04-16 DIAGNOSIS — R310 Gross hematuria: Secondary | ICD-10-CM

## 2024-04-16 LAB — URINALYSIS, ROUTINE W REFLEX MICROSCOPIC
Bilirubin, UA: NEGATIVE
Glucose, UA: NEGATIVE
Leukocytes,UA: NEGATIVE
Nitrite, UA: NEGATIVE
Specific Gravity, UA: >1.03 — ABNORMAL HIGH (ref 1.005–1.030)
Urobilinogen, Ur: 0.2 mg/dL (ref 0.2–1.0)
pH, UA: 5.5 (ref 5.0–7.5)

## 2024-04-16 LAB — MICROSCOPIC EXAMINATION

## 2024-04-16 MED ORDER — CIPROFLOXACIN HCL 500 MG PO TABS
500.0000 mg | ORAL_TABLET | Freq: Once | ORAL | Status: AC
  Administered 2024-04-16: 500 mg via ORAL

## 2024-04-21 ENCOUNTER — Ambulatory Visit (AMBULATORY_SURGERY_CENTER): Payer: PRIVATE HEALTH INSURANCE | Admitting: Gastroenterology

## 2024-04-21 ENCOUNTER — Encounter: Payer: Self-pay | Admitting: Gastroenterology

## 2024-04-21 VITALS — BP 114/71 | HR 62 | Temp 97.7°F | Resp 12 | Ht 76.0 in | Wt 225.0 lb

## 2024-04-21 DIAGNOSIS — Z860100 Personal history of colon polyps, unspecified: Secondary | ICD-10-CM

## 2024-04-21 DIAGNOSIS — Z1211 Encounter for screening for malignant neoplasm of colon: Secondary | ICD-10-CM

## 2024-04-21 DIAGNOSIS — D120 Benign neoplasm of cecum: Secondary | ICD-10-CM

## 2024-04-21 DIAGNOSIS — Z800 Family history of malignant neoplasm of digestive organs: Secondary | ICD-10-CM

## 2024-04-21 DIAGNOSIS — D125 Benign neoplasm of sigmoid colon: Secondary | ICD-10-CM

## 2024-04-21 MED ORDER — SODIUM CHLORIDE 0.9 % IV SOLN: 500.0000 mL | INTRAVENOUS | Status: DC

## 2024-04-21 NOTE — Op Note (Signed)
 [facility] Patient Name: Landynn Dupler Procedure Date: 04/21/2024 9:13 AM MRN: [id number] Endoscopist: Victory L. Legrand , MD, [id number] Age: 68 Referring MD:  Date of Birth: 1956-07-24 Gender: Male Account #: [id number] Procedure:                Colonoscopy Indications:              Colon cancer screening in patient at increased                            risk: Colorectal cancer in brother,                           Surveillance: Personal history of adenomatous                            polyps on last colonoscopy 3 years ago                           5 TA and 2 HP polyps July 2022                           brother Dx with and died from [facility] in the interim Medicines:                Monitored Anesthesia Care Procedure:                Pre-Anesthesia Assessment:                           - Prior to the procedure, a History and Physical                            was performed, and patient medications and                            allergies were reviewed. The patient's tolerance of                            previous anesthesia was also reviewed. The risks                            and benefits of the procedure and the sedation                            options and risks were discussed with the patient.                            All questions were answered, and informed consent                            was obtained. Prior Anticoagulants: The patient has                            taken no anticoagulant or antiplatelet agents. ASA  Grade Assessment: II - A patient with mild systemic                            disease. After reviewing the risks and benefits,                            the patient was deemed in satisfactory condition to                            undergo the procedure.                           After obtaining informed consent, the colonoscope                            was passed under direct vision. Throughout the                             procedure, the patient's blood pressure, pulse, and                            oxygen saturations were monitored continuously. The                            Olympus Scope SN: [id number] was introduced through                            the anus and advanced to the the cecum, identified                            by appendiceal orifice and ileocecal valve. The                            colonoscopy was performed without difficulty. The                            patient tolerated the procedure well. The quality                            of the bowel preparation was excellent. The                            ileocecal valve, appendiceal orifice, and rectum                            were photographed. Scope In: 9:28:44 AM Scope Out: 9:42:51 AM Scope Withdrawal Time: 0 hours 11 minutes 4 seconds  Total Procedure Duration: 0 hours 14 minutes 7 seconds  Findings:                 The perianal and digital rectal examinations were                            normal.  Repeat examination of right colon under NBI                            performed.                           Two sessile polyps were found in the sigmoid colon                            and cecum. The polyps were diminutive in size.                            These polyps were removed with a cold snare.                            Resection and retrieval were complete.                           The exam was otherwise without abnormality on                            direct and retroflexion views. Complications:            No immediate complications. Estimated Blood Loss:     Estimated blood loss was minimal. Impression:               - Two diminutive polyps in the sigmoid colon and in                            the cecum, removed with a cold snare. Resected and                            retrieved.                           - The examination was otherwise normal on direct                            and  retroflexion views. Recommendation:           - Patient has a contact number available for                            emergencies. The signs and symptoms of potential                            delayed complications were discussed with the                            patient. Return to normal activities tomorrow.                            Written discharge instructions were provided to the                            patient.                           -  Resume previous diet.                           - Continue present medications.                           - Await pathology results.                           - Repeat colonoscopy in 5 years for surveillance                            and Fam Hx of CRC. Neysa Arts L. Legrand, MD 04/21/2024 9:47:44 AM This report has been signed electronically.

## 2024-04-21 NOTE — Progress Notes (Signed)
 Called to room to assist during endoscopic procedure.  Patient ID and intended procedure confirmed with present staff. Received instructions for my participation in the procedure from the performing physician.

## 2024-04-21 NOTE — Progress Notes (Signed)
 History and Physical:  This patient presents for endoscopic testing for: Encounter Diagnoses  Name Primary?   Hx of colonic polyps Yes   Family history of colon cancer     Surveillance colonoscopy today for Hx colon polyps 5 TA and 2 HP July 2022  Since I last saw him, a brother was Dx with CRC and died from that  Patient denies chronic abdominal pain, rectal bleeding, constipation or diarrhea.    Patient is otherwise without complaints or active issues today.   Past Medical History: Past Medical History:  Diagnosis Date   Calcaneus fracture, right    Diabetes mellitus without complication (HCC)    History of kidney stones    Kidney stone    Shingles      Past Surgical History: Past Surgical History:  Procedure Laterality Date   EXTRACORPOREAL SHOCK WAVE LITHOTRIPSY Left 07/28/2019   Procedure: EXTRACORPOREAL SHOCK WAVE LITHOTRIPSY (ESWL);  Surgeon: Carolee Sherwood JONETTA DOUGLAS, MD;  Location: [facility];  Service: Urology;  Laterality: Left;   LITHOTRIPSY     ORIF CALCANEOUS FRACTURE Right 04/25/2015   Procedure: OPEN REDUCTION INTERNAL FIXATION (ORIF) RIGHT CALCANEOUS FRACTURE;  Surgeon: Jerona Harden GAILS, MD;  Location: [facility];  Service: Orthopedics;  Laterality: Right;   URETEROSCOPY      Allergies: No Known Allergies  Outpatient Meds: Current Outpatient Medications  Medication Sig Dispense Refill   metFORMIN  (GLUCOPHAGE ) 1000 MG tablet Take 1 tablet (1,000 mg total) by mouth 2 (two) times daily with a meal. MUST SCHEDULE OV FOR REFILLS 60 tablet 0   tamsulosin  (FLOMAX ) 0.4 MG CAPS capsule TAKE 1 CAPSULE BY MOUTH EVERYDAY AT BEDTIME 90 capsule 3   Continuous Blood Gluc Sensor (FREESTYLE LIBRE 3 SENSOR) MISC Apply 1 Units topically as directed. Place 1 sensor on the skin every 14 days. Use to check glucose continuously 1 each 6   meloxicam  (MOBIC ) 15 MG tablet TAKE 1 TABLET (15 MG TOTAL) BY MOUTH DAILY. (Patient not taking: No sig reported) 30 tablet 0   RYBELSUS  3 MG TABS Take 1  tablet by mouth daily. (Patient not taking: No sig reported)     tadalafil  (CIALIS ) 20 MG tablet Take 0.5-1 tablets (10-20 mg total) by mouth every other day as needed for erectile dysfunction. (Patient not taking: No sig reported) 10 tablet 1   tadalafil  (CIALIS ) 20 MG tablet Take 0.5-1 tablets (10-20 mg total) by mouth as needed. (Patient not taking: No sig reported) 15 tablet 11   Current Facility-Administered Medications  Medication Dose Route Frequency Provider Last Rate Last Admin   0.9 %  sodium chloride  infusion  500 mL Intravenous Continuous Danis, Victory CROME III, MD          ___________________________________________________________________ Objective   Exam:  BP 126/88   Pulse (!) 59   Temp 97.7 F (36.5 C) (Temporal)   Resp 11   Ht 6' 4 (1.93 m)   Wt 225 lb (102.1 kg)   SpO2 100%   BMI 27.39 kg/m   CV: regular , S1/S2 Resp: clear to auscultation bilaterally, normal RR and effort noted GI: soft, no tenderness, with active bowel sounds.   Assessment: Encounter Diagnoses  Name Primary?   Hx of colonic polyps Yes   Family history of colon cancer      Plan: Colonoscopy   The benefits and risks of the planned procedure(s) were described in detail with the patient or (when appropriate) their health care proxy.  Risks were outlined as including, but not limited  to, bleeding, infection, perforation, adverse medication reaction leading to cardiac or pulmonary decompensation, pancreatitis (if ERCP).  The limitation of incomplete mucosal visualization was also discussed.  No guarantees or warranties were given.  The patient is appropriate for an endoscopic procedure in the ambulatory setting.   - Victory Brand, MD

## 2024-04-21 NOTE — Progress Notes (Signed)
 Pt resting comfortably. VSS. Airway intact. SBAR complete to RN. All questions answered.

## 2024-04-21 NOTE — Progress Notes (Signed)
 Pt's states no medical or surgical changes since previsit or office visit.

## 2024-04-21 NOTE — Patient Instructions (Signed)
 YOU HAD AN ENDOSCOPIC PROCEDURE TODAY AT THE [facility]:   Refer to the procedure report that was given to you for any specific questions about what was found during the examination.  If the procedure report does not answer your questions, please call your gastroenterologist to clarify.  If you requested that your care partner not be given the details of your procedure findings, then the procedure report has been included in a sealed envelope for you to review at your convenience later.  YOU SHOULD EXPECT: Some feelings of bloating in the abdomen. Passage of more gas than usual.  Walking can help get rid of the air that was put into your GI tract during the procedure and reduce the bloating. If you had a lower endoscopy (such as a colonoscopy or flexible sigmoidoscopy) you may notice spotting of blood in your stool or on the toilet paper. If you underwent a bowel prep for your procedure, you may not have a normal bowel movement for a few days.  Please Note:  You might notice some irritation and congestion in your nose or some drainage.  This is from the oxygen used during your procedure.  There is no need for concern and it should clear up in a day or so.  SYMPTOMS TO REPORT IMMEDIATELY:  Following lower endoscopy (colonoscopy or flexible sigmoidoscopy):  Excessive amounts of blood in the stool  Significant tenderness or worsening of abdominal pains  Swelling of the abdomen that is new, acute  Fever of 100F or higher  Resume previous diet Continue present medications Await pathology results Repeat colonoscopy in 5 years for surveillance and family history   For urgent or emergent issues, a gastroenterologist can be reached at any hour by calling [phone]. Do not use MyChart messaging for urgent concerns.    DIET:  We do recommend a small meal at first, but then you may proceed to your regular diet.  Drink plenty of fluids but you should avoid alcoholic beverages for 24  hours.  ACTIVITY:  You should plan to take it easy for the rest of today and you should NOT DRIVE or use heavy machinery until tomorrow (because of the sedation medicines used during the test).    FOLLOW UP: Our staff will call the number listed on your records the next business day following your procedure.  We will call around 7:15- 8:00 am to check on you and address any questions or concerns that you may have regarding the information given to you following your procedure. If we do not reach you, we will leave a message.     If any biopsies were taken you will be contacted by phone or by letter within the next 1-3 weeks.  Please call us  at [phone] if you have not heard about the biopsies in 3 weeks.    SIGNATURES/CONFIDENTIALITY: You and/or your care partner have signed paperwork which will be entered into your electronic medical record.  These signatures attest to the fact that that the information above on your After Visit Summary has been reviewed and is understood.  Full responsibility of the confidentiality of this discharge information lies with you and/or your care-partner.

## 2024-04-22 ENCOUNTER — Encounter: Payer: Self-pay | Admitting: Urology

## 2024-04-22 ENCOUNTER — Telehealth: Payer: Self-pay | Admitting: *Deleted

## 2024-04-22 NOTE — Telephone Encounter (Signed)
  Follow up Call-     04/21/2024    8:20 AM  Call back number  Post procedure Call Back phone  # [phone]  Permission to leave phone message Yes     Patient questions:  Do you have a fever, pain , or abdominal swelling? No. Pain Score  0 *  Have you tolerated food without any problems? Yes.    Have you been able to return to your normal activities? Yes.    Do you have any questions about your discharge instructions: Diet   No. Medications  No. Follow up visit  No.  Do you have questions or concerns about your Care? No.  Actions: * If pain score is 4 or above: No action needed, pain <4.

## 2024-04-23 ENCOUNTER — Other Ambulatory Visit: Payer: PRIVATE HEALTH INSURANCE

## 2024-04-23 ENCOUNTER — Ambulatory Visit: Admitting: Urology

## 2024-04-23 ENCOUNTER — Other Ambulatory Visit: Payer: Self-pay

## 2024-04-23 DIAGNOSIS — N138 Other obstructive and reflux uropathy: Secondary | ICD-10-CM

## 2024-04-24 ENCOUNTER — Other Ambulatory Visit: Payer: PRIVATE HEALTH INSURANCE

## 2024-04-24 DIAGNOSIS — N138 Other obstructive and reflux uropathy: Secondary | ICD-10-CM

## 2024-04-24 LAB — SURGICAL PATHOLOGY

## 2024-04-25 LAB — BUN+CREAT
BUN/Creatinine Ratio: 15 (ref 10–24)
BUN: 20 mg/dL (ref 8–27)
Creatinine, Ser: 1.3 mg/dL — ABNORMAL HIGH (ref 0.76–1.27)
eGFR: 61 mL/min/1.73 (ref >59)

## 2024-04-28 ENCOUNTER — Ambulatory Visit (HOSPITAL_BASED_OUTPATIENT_CLINIC_OR_DEPARTMENT_OTHER): Admission: RE | Admit: 2024-04-28 | Payer: PRIVATE HEALTH INSURANCE | Source: Ambulatory Visit

## 2024-04-28 ENCOUNTER — Other Ambulatory Visit: Payer: Self-pay | Admitting: Family

## 2024-04-28 NOTE — Telephone Encounter (Signed)
 This was for an acute from in April, please review.

## 2024-04-29 ENCOUNTER — Ambulatory Visit: Payer: Self-pay | Admitting: Gastroenterology

## 2024-05-01 ENCOUNTER — Telehealth: Payer: Self-pay

## 2024-05-01 NOTE — Telephone Encounter (Signed)
-----   Message from Garnette HERO Dahlstedt sent at 04/30/2024  3:03 PM EDT ----- Please call patient-good news, urine cytology showed no evidence of cancer cells. ----- Message ----- From: Carolee Madelin HERO Sent: 04/22/2024   9:38 AM EDT To: Garnette Shack, MD

## 2024-05-01 NOTE — Telephone Encounter (Signed)
Spoke with pt in reference to cytology results. Pt voiced understanding.  

## 2024-05-16 ENCOUNTER — Ambulatory Visit (HOSPITAL_BASED_OUTPATIENT_CLINIC_OR_DEPARTMENT_OTHER): Admission: RE | Admit: 2024-05-16 | Payer: PRIVATE HEALTH INSURANCE | Source: Ambulatory Visit

## 2024-06-24 ENCOUNTER — Other Ambulatory Visit: Payer: Self-pay | Admitting: Family

## 2024-06-24 MED ORDER — RYBELSUS 3 MG PO TABS: 1.0000 | ORAL_TABLET | Freq: Every day | ORAL | 1 refills | Status: AC

## 2024-06-24 NOTE — Telephone Encounter (Unsigned)
 Copied from CRM #[id number]. Topic: Clinical - Medication Refill >> Jun 24, 2024  2:58 PM Mercedes MATSU wrote: Medication:  RYBELSUS  7 MG TABS    Has the patient contacted their pharmacy? Yes (Agent: If no, request that the patient contact the pharmacy for the refill. If patient does not wish to contact the pharmacy document the reason why and proceed with request.) (Agent: If yes, when and what did the pharmacy advise?)  This is the patient's preferred pharmacy:  [id number] [address] [facility] - [address] [place] [address] Phone: [phone] Fax: [phone]  Is this the correct pharmacy for this prescription? Yes If no, delete pharmacy and type the correct one.   Has the prescription been filled recently? Yes  Is the patient out of the medication? Yes  Has the patient been seen for an appointment in the last year OR does the patient have an upcoming appointment? Yes  Can we respond through MyChart? Yes  Agent: Please be advised that Rx refills may take up to 3 business days. We ask that you follow-up with your pharmacy.

## 2024-09-11 ENCOUNTER — Other Ambulatory Visit (HOSPITAL_COMMUNITY): Payer: Self-pay

## 2024-09-11 ENCOUNTER — Telehealth: Payer: Self-pay

## 2024-09-11 NOTE — Telephone Encounter (Signed)
 Pharmacy Patient Advocate Encounter   Received notification from Onbase that prior authorization for Rybelsus  3 is required/requested.   Insurance verification completed.   The patient is insured through [facility].   Per test claim: PA required; PA submitted to above mentioned insurance via Latent Key/confirmation #/EOC [id number] Status is pending

## 2024-09-15 ENCOUNTER — Other Ambulatory Visit (HOSPITAL_COMMUNITY): Payer: Self-pay

## 2024-09-15 NOTE — Telephone Encounter (Signed)
 Pharmacy Patient Advocate Encounter  Received notification from [employer] that Prior Authorization for Rybelsus  3 has been APPROVED from 09/11/24 to 09/24/25. Ran test claim, Copay is $47.00. This test claim was processed through [facility]- copay amounts may vary at other pharmacies due to pharmacy/plan contracts, or as the patient moves through the different stages of their insurance plan.   PA #/Case ID/Reference #: # [id number]

## 2024-09-29 NOTE — Telephone Encounter (Signed)
 Noted
